# Patient Record
Sex: Male | Born: 1945 | Hispanic: Yes | Marital: Married | Smoking: Former smoker
Health system: Southern US, Community
[De-identification: ages and names within clinical notes are randomized; demographics above are authoritative.]

## PROBLEM LIST (undated history)

## (undated) DIAGNOSIS — Z87442 Personal history of urinary calculi: Secondary | ICD-10-CM

## (undated) DIAGNOSIS — C61 Malignant neoplasm of prostate: Secondary | ICD-10-CM

## (undated) DIAGNOSIS — I499 Cardiac arrhythmia, unspecified: Secondary | ICD-10-CM

## (undated) DIAGNOSIS — I10 Essential (primary) hypertension: Secondary | ICD-10-CM

## (undated) DIAGNOSIS — I213 ST elevation (STEMI) myocardial infarction of unspecified site: Secondary | ICD-10-CM

## (undated) DIAGNOSIS — I2510 Atherosclerotic heart disease of native coronary artery without angina pectoris: Secondary | ICD-10-CM

## (undated) DIAGNOSIS — M1990 Unspecified osteoarthritis, unspecified site: Secondary | ICD-10-CM

## (undated) DIAGNOSIS — R570 Cardiogenic shock: Secondary | ICD-10-CM

## (undated) DIAGNOSIS — I4891 Unspecified atrial fibrillation: Secondary | ICD-10-CM

## (undated) DIAGNOSIS — E119 Type 2 diabetes mellitus without complications: Secondary | ICD-10-CM

## (undated) DIAGNOSIS — Z95810 Presence of automatic (implantable) cardiac defibrillator: Secondary | ICD-10-CM

## (undated) DIAGNOSIS — K219 Gastro-esophageal reflux disease without esophagitis: Secondary | ICD-10-CM

## (undated) DIAGNOSIS — N209 Urinary calculus, unspecified: Secondary | ICD-10-CM

## (undated) DIAGNOSIS — E559 Vitamin D deficiency, unspecified: Secondary | ICD-10-CM

## (undated) DIAGNOSIS — I639 Cerebral infarction, unspecified: Secondary | ICD-10-CM

## (undated) HISTORY — PX: ICD IMPLANT: EP1208

## (undated) SURGERY — LEFT HEART CATH
Anesthesia: Moderate Sedation

---

## 2004-02-13 ENCOUNTER — Emergency Department: Payer: Self-pay | Admitting: Emergency Medicine

## 2004-03-20 ENCOUNTER — Ambulatory Visit: Payer: Self-pay | Admitting: Family Medicine

## 2004-06-26 ENCOUNTER — Ambulatory Visit: Payer: Self-pay | Admitting: Gastroenterology

## 2008-03-11 DIAGNOSIS — C61 Malignant neoplasm of prostate: Secondary | ICD-10-CM

## 2008-03-11 HISTORY — DX: Malignant neoplasm of prostate: C61

## 2008-03-11 HISTORY — PX: INSERTION PROSTATE RADIATION SEED: SUR718

## 2010-03-27 ENCOUNTER — Ambulatory Visit: Payer: Self-pay | Admitting: Rheumatology

## 2010-06-22 ENCOUNTER — Ambulatory Visit: Payer: Self-pay | Admitting: Gastroenterology

## 2010-06-27 LAB — PATHOLOGY REPORT

## 2011-06-14 ENCOUNTER — Ambulatory Visit: Payer: Self-pay | Admitting: Anesthesiology

## 2011-06-19 ENCOUNTER — Ambulatory Visit: Payer: Self-pay | Admitting: Podiatry

## 2011-11-14 ENCOUNTER — Emergency Department: Payer: Self-pay | Admitting: Emergency Medicine

## 2011-11-14 LAB — URINALYSIS, COMPLETE
Bilirubin,UR: NEGATIVE
Ketone: NEGATIVE
Nitrite: NEGATIVE
Ph: 5 (ref 4.5–8.0)
RBC,UR: 13 /HPF (ref 0–5)
Squamous Epithelial: NONE SEEN

## 2011-11-14 LAB — DIFFERENTIAL
Basophil %: 0.2 %
Eosinophil #: 0 10*3/uL (ref 0.0–0.7)
Eosinophil %: 0.4 %
Lymphocyte #: 1 10*3/uL (ref 1.0–3.6)
Neutrophil #: 8.5 10*3/uL — ABNORMAL HIGH (ref 1.4–6.5)
Neutrophil %: 83.6 %

## 2011-11-14 LAB — COMPREHENSIVE METABOLIC PANEL
Albumin: 4 g/dL (ref 3.4–5.0)
BUN: 21 mg/dL — ABNORMAL HIGH (ref 7–18)
Bilirubin,Total: 0.5 mg/dL (ref 0.2–1.0)
Chloride: 103 mmol/L (ref 98–107)
Glucose: 194 mg/dL — ABNORMAL HIGH (ref 65–99)
Osmolality: 282 (ref 275–301)
Potassium: 4.2 mmol/L (ref 3.5–5.1)
Sodium: 137 mmol/L (ref 136–145)

## 2011-11-14 LAB — CBC
MCV: 83 fL (ref 80–100)
Platelet: 221 10*3/uL (ref 150–440)
RBC: 4.85 10*6/uL (ref 4.40–5.90)
RDW: 14.2 % (ref 11.5–14.5)
WBC: 10.4 10*3/uL (ref 3.8–10.6)

## 2012-03-11 DIAGNOSIS — I639 Cerebral infarction, unspecified: Secondary | ICD-10-CM

## 2012-03-11 DIAGNOSIS — I213 ST elevation (STEMI) myocardial infarction of unspecified site: Secondary | ICD-10-CM

## 2012-03-11 HISTORY — PX: CORONARY ANGIOPLASTY WITH STENT PLACEMENT: SHX49

## 2012-03-11 HISTORY — DX: Cerebral infarction, unspecified: I63.9

## 2012-03-11 HISTORY — DX: ST elevation (STEMI) myocardial infarction of unspecified site: I21.3

## 2012-06-11 ENCOUNTER — Ambulatory Visit: Payer: Self-pay | Admitting: Rheumatology

## 2012-07-02 ENCOUNTER — Inpatient Hospital Stay: Payer: Self-pay | Admitting: Family Medicine

## 2012-07-02 LAB — TSH: Thyroid Stimulating Horm: 0.375 u[IU]/mL — ABNORMAL LOW

## 2012-07-02 LAB — URINALYSIS, COMPLETE
Bacteria: NONE SEEN
Glucose,UR: NEGATIVE mg/dL (ref 0–75)
Ketone: NEGATIVE
Nitrite: NEGATIVE
Ph: 6 (ref 4.5–8.0)
Protein: NEGATIVE
RBC,UR: 2 /HPF (ref 0–5)
Specific Gravity: 1.014 (ref 1.003–1.030)
Squamous Epithelial: <1
WBC UR: 20 /HPF (ref 0–5)

## 2012-07-02 LAB — CBC WITH DIFFERENTIAL/PLATELET
Basophil #: 0.1 10*3/uL (ref 0.0–0.1)
Eosinophil %: 0 %
HCT: 35 % — ABNORMAL LOW (ref 40.0–52.0)
HGB: 11.3 g/dL — ABNORMAL LOW (ref 13.0–18.0)
Lymphocyte #: 0.3 10*3/uL — ABNORMAL LOW (ref 1.0–3.6)
Lymphocyte %: 2.3 %
MCHC: 32.2 g/dL (ref 32.0–36.0)
MCV: 80 fL (ref 80–100)
Monocyte #: 0.3 x10 3/mm (ref 0.2–1.0)
Neutrophil #: 14 10*3/uL — ABNORMAL HIGH (ref 1.4–6.5)
RDW: 16.2 % — ABNORMAL HIGH (ref 11.5–14.5)

## 2012-07-02 LAB — COMPREHENSIVE METABOLIC PANEL
Albumin: 3 g/dL — ABNORMAL LOW (ref 3.4–5.0)
Anion Gap: 5 — ABNORMAL LOW (ref 7–16)
BUN: 19 mg/dL — ABNORMAL HIGH (ref 7–18)
Bilirubin,Total: 0.5 mg/dL (ref 0.2–1.0)
Chloride: 98 mmol/L (ref 98–107)
Co2: 34 mmol/L — ABNORMAL HIGH (ref 21–32)
Creatinine: 0.34 mg/dL — ABNORMAL LOW (ref 0.60–1.30)
EGFR (African American): >60
EGFR (Non-African Amer.): >60
Glucose: 180 mg/dL — ABNORMAL HIGH (ref 65–99)
SGOT(AST): 170 U/L — ABNORMAL HIGH (ref 15–37)
SGPT (ALT): 434 U/L — ABNORMAL HIGH (ref 12–78)
Total Protein: 6.6 g/dL (ref 6.4–8.2)

## 2012-07-04 LAB — CBC WITH DIFFERENTIAL/PLATELET
Basophil %: 0 %
Eosinophil %: 0.3 %
HCT: 34 % — ABNORMAL LOW (ref 40.0–52.0)
HGB: 10.9 g/dL — ABNORMAL LOW (ref 13.0–18.0)
Lymphocyte #: 1.2 10*3/uL (ref 1.0–3.6)
MCH: 25.4 pg — ABNORMAL LOW (ref 26.0–34.0)
MCHC: 32 g/dL (ref 32.0–36.0)
MCV: 80 fL (ref 80–100)
Neutrophil #: 7.9 10*3/uL — ABNORMAL HIGH (ref 1.4–6.5)
Platelet: 340 10*3/uL (ref 150–440)
RBC: 4.28 10*6/uL — ABNORMAL LOW (ref 4.40–5.90)
RDW: 16.1 % — ABNORMAL HIGH (ref 11.5–14.5)
WBC: 10 10*3/uL (ref 3.8–10.6)

## 2012-07-04 LAB — COMPREHENSIVE METABOLIC PANEL
Albumin: 2.6 g/dL — ABNORMAL LOW (ref 3.4–5.0)
Alkaline Phosphatase: 68 U/L (ref 50–136)
Anion Gap: 5 — ABNORMAL LOW (ref 7–16)
BUN: 16 mg/dL (ref 7–18)
Bilirubin,Total: 0.4 mg/dL (ref 0.2–1.0)
Calcium, Total: 8.6 mg/dL (ref 8.5–10.1)
Chloride: 101 mmol/L (ref 98–107)
Creatinine: 0.29 mg/dL — ABNORMAL LOW (ref 0.60–1.30)
EGFR (Non-African Amer.): >60
Osmolality: 280 (ref 275–301)
SGOT(AST): 116 U/L — ABNORMAL HIGH (ref 15–37)
SGPT (ALT): 349 U/L — ABNORMAL HIGH (ref 12–78)
Sodium: 138 mmol/L (ref 136–145)
Total Protein: 5.5 g/dL — ABNORMAL LOW (ref 6.4–8.2)

## 2012-07-05 LAB — COMPREHENSIVE METABOLIC PANEL
Alkaline Phosphatase: 69 U/L (ref 50–136)
Anion Gap: 5 — ABNORMAL LOW (ref 7–16)
Bilirubin,Total: 0.5 mg/dL (ref 0.2–1.0)
Chloride: 103 mmol/L (ref 98–107)
Co2: 32 mmol/L (ref 21–32)
Creatinine: 0.26 mg/dL — ABNORMAL LOW (ref 0.60–1.30)
EGFR (African American): >60
Glucose: 91 mg/dL (ref 65–99)
Potassium: 3.8 mmol/L (ref 3.5–5.1)
SGOT(AST): 111 U/L — ABNORMAL HIGH (ref 15–37)
Sodium: 140 mmol/L (ref 136–145)
Total Protein: 5.7 g/dL — ABNORMAL LOW (ref 6.4–8.2)

## 2012-07-05 LAB — CK: CK, Total: 2044 U/L — ABNORMAL HIGH (ref 35–232)

## 2012-07-06 LAB — CBC WITH DIFFERENTIAL/PLATELET
Basophil #: 0 10*3/uL (ref 0.0–0.1)
Basophil %: 0.1 %
Eosinophil #: 0.1 10*3/uL (ref 0.0–0.7)
HCT: 34.7 % — ABNORMAL LOW (ref 40.0–52.0)
Lymphocyte #: 1.2 10*3/uL (ref 1.0–3.6)
Lymphocyte %: 12.8 %
MCH: 25.8 pg — ABNORMAL LOW (ref 26.0–34.0)
MCV: 78 fL — ABNORMAL LOW (ref 80–100)
Monocyte %: 6.7 %
Neutrophil #: 7.6 10*3/uL — ABNORMAL HIGH (ref 1.4–6.5)
Neutrophil %: 79.7 %
Platelet: 313 10*3/uL (ref 150–440)
RBC: 4.44 10*6/uL (ref 4.40–5.90)
RDW: 16 % — ABNORMAL HIGH (ref 11.5–14.5)

## 2012-09-03 ENCOUNTER — Encounter: Payer: Self-pay | Admitting: Family Medicine

## 2012-09-08 ENCOUNTER — Encounter: Payer: Self-pay | Admitting: Family Medicine

## 2012-10-09 ENCOUNTER — Encounter: Payer: Self-pay | Admitting: Family Medicine

## 2012-11-09 ENCOUNTER — Encounter: Payer: Self-pay | Admitting: Family Medicine

## 2012-12-09 ENCOUNTER — Encounter: Payer: Self-pay | Admitting: Family Medicine

## 2013-01-09 ENCOUNTER — Encounter: Payer: Self-pay | Admitting: Family Medicine

## 2013-07-22 ENCOUNTER — Emergency Department: Payer: Self-pay | Admitting: Emergency Medicine

## 2013-11-13 ENCOUNTER — Emergency Department: Payer: Self-pay | Admitting: Emergency Medicine

## 2014-07-01 NOTE — Consult Note (Signed)
Brief Consult Note: Comments: subjectively better,swallow eval helpful, vss , but still 3/5 hip flexor weakness, CPK down,     rec Rehab placement till further  improvement in strength,PT,convert to oral steriods 60 mg qd with diabetes management.   thank you for your care of him.  Electronic Signatures: Leeanne Mannan., Eugene Gavia (MD)  (Signed 28-Apr-14 08:26)  Authored: Brief Consult Note   Last Updated: 28-Apr-14 08:26 by Leeanne Mannan., Eugene Gavia (MD)

## 2014-07-01 NOTE — H&P (Signed)
PATIENT NAME:  Jeff Boone, Jeff Boone MR#:  [id number] DATE OF BIRTH:  Aug 10, 1945  DATE OF ADMISSION:  07/02/2012  PRIMARY CARE PHYSICIAN: Youlanda Roys. Lovie Macadamia, MD  RHEUMATOLOGY: Emmaline Kluver., MD  CARDIOLOGY: At [facility].  HISTORY OF PRESENT ILLNESS: The patient is a very pleasant 69 year old Caucasian gentleman who is admitted from Dr. Lerry Paterson office after the patient was found to have significant proximal muscle weakness for the last several weeks to months along with significant weight loss. The patient also has noted increasing swallowing difficulty for the last few weeks. He is able to swallow thin liquids; however, when it comes to chunks of food, he has to take liquids to help him pass the food. He has had extensive workup recently for his "myopathy" and a muscle biopsy was done recently. He was found to have elevated CPK in the 7000s. He being off of his statins for the last 2 months. The patient is admitted to rule out malignancy or associated inflammatory myopathy. He is being admitted for further evaluation and management.   PAST MEDICAL HISTORY:  1.  Acute STEMI in January 2014 secondary to occluded LAD, status post bare metal stent placement with IABP for cardiogenic shock. This was done at [facility]. Cardiac MRI showed EF of 35% with evidence of ischemic cardiomyopathy. Echo showed EF of 40% with akinetic apex.  2.  Atrial fibrillation in the setting of STEMI. CHADS2 score of 5 with history of hypertension, CHF, diabetes and stroke; hence, he was started on apixaban/Eliquis.  3.  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 This was in January 2014. The patient had symptoms of left homonymous hemianopsia. CTA showed acute right PCA occlusion, status post emergent intra-arterial TPA and clot retrieval from left distal PCA by interventional neuroradiology team at [facility]  4.  Type 2 diabetes.  5.  Weight  loss with ongoing workup as outpatient.  6.  Proximal myopathy/myositis with generalized muscle weakness, elevation of CK and AST, ALT. The patient has followed up with Dr. Precious Reel as outpatient. He was started on 60 mg of prednisone as outpatient. Recently got his muscle biopsy done on 06/17/2012 and it showed evidence of myopathy/myositis. There is a possibility of immune-mediated inflammatory myopathy.  7.  The patient also underwent EMG and nerve conduction study with Dr. Manuella Ghazi of neurology at [facility], which showed impression of severe acute myopathy with proximal predominance, mild sensory polyneuropathy.  8.  History of psoriatic arthritis.  9.  Hypertension.  10.  Hyperlipidemia.  11.  Prostate cancer, status post recurrence in 2010, follows with Dr. Yves Dill now. The patient is cancer-free at this time.  12.  History of positive CMV. 13.  Vitamin D deficiency.  14.  History of GI bleed and ulcer.  15.  History of nephrolithiasis.   CURRENT MEDICATIONS:  1.  Uroxatral extended-release tablet 10 mg daily.  2.  Metformin 1000 mg daily.  3.  Omega-3, 1 capsule daily.  4.  Eliquis 2.5 mg daily.  5.  Aspirin 81 mg daily.  6.  Plavix 75 mg daily.  7.  Lasix 20 mg daily as needed.  8.  Lisinopril 2.5 mg daily.  9.  Prednisone 20 mg 1 tablet 3 times a day.   ALLERGIES: XYLOCAINE.   FAMILY HISTORY: Positive for CAD.   SOCIAL HISTORY: The patient is a nonsmoker. Currently retired. He lives at home with his family.   REVIEW OF SYSTEMS:  CONSTITUTIONAL: No fever. Positive for fatigue and weakness and weight loss. The patient states he has lost more than 15 pounds since January 2014.  EYES: No blurred or double vision or glaucoma.  EARS, NOSE, THROAT: No tinnitus, ear pain, hearing loss.  RESPIRATORY: No cough, wheeze, hemoptysis.  CARDIOVASCULAR: No chest pain, orthopnea, edema.  GASTROINTESTINAL: No nausea, vomiting, diarrhea or abdominal pain. Positive for difficulty  swallowing.  GENITOURINARY: No dysuria or hematuria.  ENDOCRINE: No polyuria, nocturia, thyroid problems.  HEMATOLOGIC: No anemia or easy bruising.  SKIN: No acne or rash.  MUSCULOSKELETAL: Positive for proximal myopathy.  NEUROLOGIC: No CVA or TIA.  PSYCHIATRIC: No anxiety or depression. All other systems reviewed and negative.   PHYSICAL EXAMINATION:  GENERAL: The patient is awake, alert, oriented x 3, not in acute distress.  VITAL SIGNS: He is afebrile. Pulse is 107. Blood pressure is 145/79. Sats are 98% on room air.  HEENT: Atraumatic, normocephalic. Pupils: PERRLA. EOM intact. Oral mucosa is moist.  NECK: Supple. No JVD. No carotid bruit.  LUNGS: Clear to auscultation bilaterally. No rales, rhonchi, respiratory distress or labored breathing.  HEART: Both the heart sounds are normal. Rate and rhythm regular. PMI not lateralized. Chest nontender.  EXTREMITIES: Good pedal pulses, good femoral pulses. No lower extremity edema. The patient has arthritic changes in both his upper extremities and lower extremities.  NEUROLOGIC: Grossly intact cranial nerves II through XII. The patient does have proximal motor weakness on his muscles in both upper and lower extremities. His reflexes are 1+ in both upper and lower extremities. Sensory exam is normal. There are no cranial nerve deficits noted grossly.  SKIN: Warm and dry.   LABORATORY AND RADIOLOGICAL DATA: CBC and comprehensive metabolic panel are pending. CT of the chest, abdomen and pelvis is pending at this time.   ASSESSMENT: A 69 year old patient with history of coronary artery disease, hypertension, hyperlipidemia, diabetes and more recent right posterior cerebral artery cerebrovascular accident, comes in with:  1.  Inflammatory myositis/polymyositis, severe with profound weakness and falls at home along with weight loss. The patient is admitted on the medical floor. Will start diet after he gets a CT scan of the abdomen, chest and pelvis.  Will continue intravenous fluids. Intravenous steroids will be started at 60 mg daily. Dr. Precious Reel will see the patient in consultation. Will obtain CT of the chest, abdomen and pelvis with intravenous and oral contrast to rule out malignancy. Will follow LFTs along with CPK and monitor kidney numbers. Will hold off on the patient's metformin given him getting intravenous contrast.  2.  Type 2 diabetes: Will continue sliding scale insulin and then resume metformin after 48 hours.  3.  Coronary artery disease, status post left anterior descending stent recently at [facility]. It is a bare metal stent. Will continue Plavix.  4.  History of right posterior cerebral artery circulation cerebrovascular accident with history of atrial fibrillation, status post myocardial infarction in January 2014. The patient is currently on Eliquis. Will continue 2.5 mg by mouth daily at this time.  5.  Hyperlipidemia: Hold off on statins at this time given workup for myositis/myopathy.  6.  Hypertension: Will continue lisinopril.  7.  Deep vein thrombosis prophylaxis: The patient already is on Eliquis and Plavix at this time.  8.  Dysphagia/swallowing difficulty: Will have speech therapy see patient.   Further workup per the patient's clinical course. Hospital admission plan was discussed with the patient and patient's son, who was present in  the room.   TIME SPENT: 55 minutes.   ____________________________ Hart Rochester Posey Pronto, MD sap:jm D: 07/02/2012 16:49:07 ET T: 07/02/2012 17:13:25 ET JOB#: [id number]  cc: Kayde Atkerson A. Posey Pronto, MD, <Dictator> Emmaline Kluver., MD Youlanda Roys. Lovie Macadamia, MD Ilda Basset MD ELECTRONICALLY SIGNED 07/10/2012 6:56

## 2014-07-01 NOTE — Consult Note (Signed)
Brief Consult Note: Comments: swallowing better,t notes noted, vss, but 3/5 ticeps, hip flexor . wife will have trouble taking care of him at home, but home health may help if unabe to go to rehab. fall risk.     rec prednisone 60 mg po qam, diabetes management, PPI, Ca and Vit D.  Electronic Signatures: Leeanne Mannan., Eugene Gavia (MD)  (Signed 29-Apr-14 08:22)  Authored: Brief Consult Note   Last Updated: 29-Apr-14 08:22 by Leeanne Mannan., Eugene Gavia (MD)

## 2014-07-01 NOTE — Consult Note (Signed)
PATIENT NAME:  Jeff Boone, Jeff Boone MR#:  [id number] DATE OF BIRTH:  1945-11-22  DATE OF CONSULTATION:  07/02/2012  CONSULTING PHYSICIAN:  Emmaline Kluver., MD  HISTORY OF PRESENT ILLNESS: The patient is a 69 year old male whose major complaint is muscle weakness.  Complicated story, he had hospitalization at [facility] after Christmas with heart attack and then cerebrovascular accident. He had to be on Lipitor during the hospital stay and may be for 2 weeks after that, started becoming profoundly weak, hard to get up and down from a chair. CPK went to 7000. He had transient hospitalization at [facility], worried about a statin myopathy. He was discharged and seen by me where CPK was still elevated.  He had EMG and it looked like an inflammatory myopathy and myositis. He had MRI that look like inflammatory myopathy with edema. He had biopsy showing inflammatory cells, no vasculitis.  He was placed on steroids. He is diabetic. He has been getting profoundly weaker. He has had some falls. Family cannot take care of him at home, cannot get up and out from a chair or walk unassisted. He has lost weight. He has had nocturia with urinary frequency. Sugars have gone up to about 300. Last CPK was down to 7000 to 3400, but he was profoundly weak. Liver functions were elevated. With his weight loss, there was a concern about malignancy. He has not had CT, but had prior prostate cancer.  PSA recently normal. He had a recurrence in 2010 with seeds. Prior work-up for abnormal liver functions with liver biopsy was negative. Because of his profound weakness, inability to maintain himself at home, he is admitted.   PAST MEDICAL HISTORY: 1.  Diabetes.  2.  GI bleed.  3.  Nephrolithiasis.  4.  Hypertension.  5.  Hyperlipidemia.  6.  Psoriatic arthritis, previously on methotrexate and Plaquenil.  7.  Prostate cancer.  8. Abnormal liver function, status post liver biopsy.  9.  Polymyositis or inflammatory myopathy.  10.   Surgical hammertoe repair.  11.  Muscle biopsy.  12.  Liver biopsy.   SOCIAL HISTORY: No cigarettes or alcohol.   FAMILY HISTORY: Negative for connective tissue disease.   REVIEW OF SYSTEMS: Some difficulty swallowing. No shortness of breath. No Raynaud's. No skin rash.   MEDICATIONS: Metformin 1000 mg in the  morning, 500 mg in the evening, enteric coated aspirin one a day, Plavix 75 mg 1 p.o. daily, Lasix p.r.n., lisinopril 75 mg daily, prednisone 60 mg p.o. daily.   ALLERGIES: XYLOCAINE.   PHYSICAL EXAMINATION: GENERAL: Pleasant male, wheelchair bound, weak, cachectic-appearing. VITAL SIGNS:  Cannot weigh, blood pressure 120/64, pulse regular at 90, temperature 98.  SKIN: With some psoriatic patches on his elbows and nails. No telangiectasias. No gottrens patch, no heliotrope, no erythema of the shoulders.  HEENT: Sclerae clear.  NECK: Good carotid upstroke.  HEART:  Regular rhythm.  No significant murmur.  CHEST: Clear.  ABDOMEN: No visceromegaly.  EXTREMITIES: No significant edema.  MUSCULOSKELETAL:  Good range of motion of neck and shoulders. He has synovitis across the DIPs. Hips move well. Knees without effusions.  NEUROLOGIC: He has symmetric lower extremity reflexes but has 4-/5 biceps and triceps 3/5 hip flexors, 3/5 neck flexors, cannot get up from a sitting position or maintain a standing position.   IMPRESSION: 1.  Profound inflammatory myopathy. Rule out malignancy related. Rule out statin-related or idiopathic.  2.  Diabetes.  3.  Recent MI/CVA .  4.  Weight loss. Rule out malignancy  with prior history of prostate cancer.  5.  Elevated liver functions.   PLAN: 1.  Steroids continuation.  I would like to get his steroids longer before adding  any immunosuppressive drugs. CPK has come down. Muscle strength has not improved.  2.  Physical therapy and rehab.  3.  Work-up malignancy with abdominal, pelvic and chest CT.  4.  May need evaluation for dysphagia.  5.   Diabetic control. Probably needs insulin, given his steroids. Probably needs proton pump inhibitor protection for his steroids,  given his steroids with prior history of peptic ulcer disease.  6.  Sent muscle antibody panel from the office.   Thank you very much for taking care of him.   ____________________________ Emmaline Kluver., MD gwk:cc D: 07/02/2012 16:45:32 ET T: 07/02/2012 17:34:43 ET JOB#: [id number]  cc: Emmaline Kluver., MD, <Dictator> Youlanda Roys. Lovie Macadamia, MD Ovidio Hanger MD ELECTRONICALLY SIGNED 07/06/2012 8:24

## 2014-07-01 NOTE — Consult Note (Signed)
Brief Consult Note: Comments: no change in muscle weakness,some difficulty swallowing, vss ,3/5 prox muscle weakness, CT noted.      rec PT,speech eval, followup TSH,(T3 T4 ordered,R/o hyperthyroid), Rehab, continue steriods, glucose management,consider adding PPI given his ulcer history.       thanks for your care of him.  Electronic Signatures: Leeanne Mannan., Eugene Gavia (MD)  (Signed 25-Apr-14 10:16)  Authored: Brief Consult Note   Last Updated: 25-Apr-14 10:16 by Leeanne Mannan., Eugene Gavia (MD)

## 2014-07-01 NOTE — Discharge Summary (Signed)
PATIENT NAME:  Jeff Boone, Jeff Boone MR#:  [id number] DATE OF BIRTH:  09/30/1945  DATE OF ADMISSION:  07/02/2012 DATE OF DISCHARGE:  07/07/2012  DISPOSITION: Home.   ADMISSION DIAGNOSIS:  Severe weakness.   FINAL DIAGNOSES: 1.  Statin-induced profound myositis.  2.  Elevated LFTs.  3.  Abnormal TSH related to stress.  4.  Resent diagnosis of coronary artery disease  and cerebrovascular accident on previous admission at [facility]. 5.  Type 2 non-insulin-dependent diabetes.  6.  Urinary tract infection.    MEDICATIONS AT DISCHARGE:  1.  Omeprazole 20 mg once daily.  2.  Alfuzosin 10 mg once a day.  3.  Metformin 1000 mg in the morning and 500 mg at bedtime.  4.  Prednisone 20 mg once a day.  5.  Lisinopril 2.5 mg once a day.  6.  Plavix 75 mg once a day.  7.  Omega-3 fatty acids 1 gram once a day. 8. Glucerna shakes that 2 times daily. 8. Apixaban  2.5 mg once a day.  9.  Ciprofloxacin 500 mg every 12 hours for two more days.    HOME HEALTH:  Therapy with speech therapy and physical therapy.   DIET:  Low fat, low cholesterol as Glucerna.   FOLLOW-UP: Dr. Percell Boston in 5-7 days for taper of steroid treatment and recheck of labs.   HOSPITAL COURSE: The patient is a very nice 69 year old gentleman who has history of coronary artery disease. He has been recently diagnosed with myocardial infarction  for what he was admitted at [facility]. The patient presented to [facility] on 07/02/2012.  Please see H and P dictated by Dr. Fritzi Mandes at admission.  He also follows with Dr. Precious Reel for his rheumatoid arthritis. During one of these office visits, he was found to have significant proximal muscle weakness that has been getting worse for past several weeks. The patient has significant weight loss within the past weeks, likely due to this breakdown of the muscle and he has increased difficulty with swallowing. He was diagnosed with myopathy likely due to recently started  statins.  His  CPK was 7000 at admission. Apparently the patient has been off, statins for 2 months He was at [facility] January 2014, secondary to occluded LAD and he underwent a bare metal stent placement and he was on IAPB for cardiogenic shock. His ejection fraction was 35% at that moment and an echocardiogram was done showing a 40% ejection fraction withwall abnoramlity motion. At the same time, the patient had atrial fibrillation, [place] score was around 5 and he was started on Eliquis. During this hospitalization, the patient also had arterial ischemic right PCA stroke right after his STEMI  in January 2014 that left him with left homonymous  hemianopsia. CTI of the brain was done at that moment showing acute right PCA occlusion and the patient had an emergent intra-arterial TPA. The patient was admitted for treatment of his myositis, which was likely due to statins. The patient was put on 60 mg daily of prednisone and had some moderate improvement of the symptoms. The patient was on intravenous steroids at the beginning and now he is going to go home on 60 mg once a day. As far as his other medical problems:  1.  Inflammatory myositis. The patient was recommended at some point by Dr. Jefm Bryant to go to rehab. Unfortunately the patient does not meet criteria as he ambulates more than 200 feet. The patient is going to go home  with home health therapy.  2.  Type 2 diabetes. Continue metformin.  3.  Coronary artery disease. Since the patient is not able to take statins, we recommended drastic changes in diet and use of fatty acids like fish oil if patient is able to tolerate it. That he needs to continue the use of Plavix since he has a bare metal stent.  4. History of atrial fibrillation. The patient also has a history of cerebrovascular accident, for which he is taking Eliquis. Continue Eliquis at home.   5. Hyperlipidemia. The statin medication has been removed.   6.  Hypertension. Well controlled lisinopril.   7.  Dysphagia and swallowing difficulty. This is due to general dilatation of the muscles. Speech therapy has been consulted for outpatient and the patient is going to be taking his steroids.   TIME SPENT: I spent about 45 minutes with this discharge today.   ____________________________ Westley Sink, MD rsg:cc D: 07/07/2012 15:20:30 ET T: 07/07/2012 18:20:08 ET JOB#: [id number]  cc: [place] Sink, MD, <Dictator> Emmaline Kluver., MD Youlanda Roys. Lovie Macadamia, MD Roselie Awkward America Brown MD ELECTRONICALLY SIGNED 07/11/2012 11:08

## 2014-07-01 NOTE — Consult Note (Signed)
Brief Consult Note: Consult note dictated.   Discussed with Attending MD.   Comments: 1 inflammatory myositis/polymyositis ,severe , with profound weakness, falls  2 weight loss, r/o malignancy 3 diabetes, worse on steriods 4 recent MI,CVA  5 abnl LFTS , may be muscle elevations of transaminases    rec 1 steriods 2 diabetic control 3 w/up malignancy with CT chest/abd/pelvis 4 may swallowing eval 5 PT/OT 6 rehab        would like to give steriods longer before adding an immunosuppressive         i sent muscle antibodies /anti HMGCR from office            thank you  for  your help with this patient.  Electronic Signatures: Leeanne Mannan., Eugene Gavia (MD)  (Signed 24-Apr-14 16:35)  Authored: Brief Consult Note   Last Updated: 24-Apr-14 16:35 by Leeanne Mannan., Eugene Gavia (MD)

## 2014-12-09 ENCOUNTER — Encounter: Payer: Self-pay | Admitting: *Deleted

## 2014-12-12 ENCOUNTER — Ambulatory Visit
Admission: RE | Admit: 2014-12-12 | Discharge: 2014-12-12 | Disposition: A | Discharge status: Home / Self Care | Payer: Medicare Other | Source: Ambulatory Visit | Attending: Gastroenterology | Admitting: Gastroenterology

## 2014-12-12 ENCOUNTER — Encounter: Payer: Self-pay | Admitting: Gastroenterology

## 2014-12-12 ENCOUNTER — Ambulatory Visit: Payer: Medicare Other | Admitting: Anesthesiology

## 2014-12-12 ENCOUNTER — Encounter
Admission: RE | Disposition: A | Discharge status: Home / Self Care | Payer: Self-pay | Source: Ambulatory Visit | Attending: Gastroenterology

## 2014-12-12 DIAGNOSIS — I252 Old myocardial infarction: Secondary | ICD-10-CM | POA: Insufficient documentation

## 2014-12-12 DIAGNOSIS — E119 Type 2 diabetes mellitus without complications: Secondary | ICD-10-CM | POA: Insufficient documentation

## 2014-12-12 DIAGNOSIS — Z7901 Long term (current) use of anticoagulants: Secondary | ICD-10-CM | POA: Insufficient documentation

## 2014-12-12 DIAGNOSIS — K635 Polyp of colon: Secondary | ICD-10-CM | POA: Diagnosis not present

## 2014-12-12 DIAGNOSIS — Z8673 Personal history of transient ischemic attack (TIA), and cerebral infarction without residual deficits: Secondary | ICD-10-CM | POA: Diagnosis not present

## 2014-12-12 DIAGNOSIS — Z1211 Encounter for screening for malignant neoplasm of colon: Secondary | ICD-10-CM | POA: Diagnosis present

## 2014-12-12 DIAGNOSIS — I10 Essential (primary) hypertension: Secondary | ICD-10-CM | POA: Diagnosis not present

## 2014-12-12 DIAGNOSIS — Z8546 Personal history of malignant neoplasm of prostate: Secondary | ICD-10-CM | POA: Diagnosis not present

## 2014-12-12 DIAGNOSIS — I4891 Unspecified atrial fibrillation: Secondary | ICD-10-CM | POA: Diagnosis not present

## 2014-12-12 DIAGNOSIS — Z7982 Long term (current) use of aspirin: Secondary | ICD-10-CM | POA: Insufficient documentation

## 2014-12-12 DIAGNOSIS — I2510 Atherosclerotic heart disease of native coronary artery without angina pectoris: Secondary | ICD-10-CM | POA: Diagnosis not present

## 2014-12-12 HISTORY — DX: Type 2 diabetes mellitus without complications: E11.9

## 2014-12-12 HISTORY — DX: Malignant neoplasm of prostate: C61

## 2014-12-12 HISTORY — DX: Cardiogenic shock: R57.0

## 2014-12-12 HISTORY — DX: Unspecified atrial fibrillation: I48.91

## 2014-12-12 HISTORY — DX: Vitamin D deficiency, unspecified: E55.9

## 2014-12-12 HISTORY — DX: Urinary calculus, unspecified: N20.9

## 2014-12-12 HISTORY — PX: COLONOSCOPY WITH PROPOFOL: SHX5780

## 2014-12-12 HISTORY — DX: Atherosclerotic heart disease of native coronary artery without angina pectoris: I25.10

## 2014-12-12 HISTORY — DX: Cerebral infarction, unspecified: I63.9

## 2014-12-12 HISTORY — DX: ST elevation (STEMI) myocardial infarction of unspecified site: I21.3

## 2014-12-12 HISTORY — DX: Essential (primary) hypertension: I10

## 2014-12-12 LAB — GLUCOSE, CAPILLARY: Glucose-Capillary: 99 mg/dL (ref 65–99)

## 2014-12-12 SURGERY — COLONOSCOPY WITH PROPOFOL
Anesthesia: General

## 2014-12-12 MED ORDER — PHENYLEPHRINE HCL 10 MG/ML IJ SOLN
INTRAMUSCULAR | Status: DC | PRN
  Administered 2014-12-12: 100 ug via INTRAVENOUS

## 2014-12-12 MED ORDER — METOPROLOL SUCCINATE ER 25 MG PO TB24
25.0000 mg | ORAL_TABLET | Freq: Once | ORAL | Status: AC
  Administered 2014-12-12: 25 mg via ORAL
  Filled 2014-12-12: qty 1

## 2014-12-12 MED ORDER — SODIUM CHLORIDE 0.9 % IV SOLN
INTRAVENOUS | Status: DC
Start: 2014-12-12 — End: 2014-12-12
  Administered 2014-12-12: 1000 mL via INTRAVENOUS

## 2014-12-12 MED ORDER — PROPOFOL 500 MG/50ML IV EMUL
INTRAVENOUS | Status: DC | PRN
  Administered 2014-12-12: 120 ug/kg/min via INTRAVENOUS

## 2014-12-12 MED ORDER — PROPOFOL 10 MG/ML IV BOLUS
INTRAVENOUS | Status: DC | PRN
  Administered 2014-12-12: 50 mg via INTRAVENOUS

## 2014-12-12 MED ORDER — SODIUM CHLORIDE 0.9 % IV SOLN: INTRAVENOUS | Status: DC

## 2014-12-12 MED ORDER — MIDAZOLAM HCL 2 MG/2ML IJ SOLN
INTRAMUSCULAR | Status: DC | PRN
  Administered 2014-12-12: 1 mg via INTRAVENOUS

## 2014-12-12 NOTE — H&P (Signed)
Primary Care Physician:  No primary care provider on file. Primary Gastroenterologist:  Dr. Candace Cruise  Pre-Procedure History & Physical: HPI:  Jeff Boone is a 69 y.o. male is here for an colonoscopy.   Past Medical History  Diagnosis Date  . Hypertension   . Diabetes mellitus without complication ([place])   . Coronary artery disease   . Urolithiasis   . STEMI (ST elevation myocardial infarction) ([place])   . Cardiogenic shock ([place])   . Atrial fibrillation ([place])   . Arterial ischemic stroke ([place])   . Prostate cancer ([place])   . Vitamin D deficiency     History reviewed. No pertinent past surgical history.  Prior to Admission medications   Medication Sig Start Date End Date Taking? Authorizing Provider  alendronate (FOSAMAX) 70 MG tablet Take 70 mg by mouth once a week. Take with a full glass of water on an empty stomach.   Yes Historical Provider, MD  alfuzosin (UROXATRAL) 10 MG 24 hr tablet Take 10 mg by mouth daily with breakfast.   Yes Historical Provider, MD  apixaban (ELIQUIS) 5 MG TABS tablet Take 5 mg by mouth 2 (two) times daily.   Yes Historical Provider, MD  aspirin 81 MG tablet Take 81 mg by mouth daily.   Yes Historical Provider, MD  lisinopril (PRINIVIL,ZESTRIL) 2.5 MG tablet Take 2.5 mg by mouth daily.   Yes Historical Provider, MD  metFORMIN (GLUMETZA) 1000 MG (MOD) 24 hr tablet Take 1,000 mg by mouth 2 (two) times daily with a meal.   Yes Historical Provider, MD  metoprolol succinate (TOPROL-XL) 25 MG 24 hr tablet Take 25 mg by mouth daily.   Yes Historical Provider, MD  omeprazole (PRILOSEC) 20 MG capsule Take 20 mg by mouth daily.   Yes Historical Provider, MD  predniSONE (DELTASONE) 5 MG tablet Take 5 mg by mouth every other day.   Yes Historical Provider, MD  tamsulosin (FLOMAX) 0.4 MG CAPS capsule Take 0.4 mg by mouth daily.   Yes Historical Provider, MD  Triamcinolone Acetonide (TRIAMCINOLONE 0.1 % CREAM : EUCERIN) CREA Apply 1 application topically 2 (two)  times daily.   Yes Historical Provider, MD    Allergies as of 10/31/2014  . (Not on File)    History reviewed. No pertinent family history.  Social History   Social History  . Marital Status: Married    Spouse Name: N/A  . Number of Children: N/A  . Years of Education: N/A   Occupational History  . Not on file.   Social History Main Topics  . Smoking status: Not on file  . Smokeless tobacco: Not on file  . Alcohol Use: Not on file  . Drug Use: Not on file  . Sexual Activity: Not on file   Other Topics Concern  . Not on file   Social History Narrative    Review of Systems: See HPI, otherwise negative ROS  Physical Exam: There were no vitals taken for this visit. General:   Alert,  pleasant and cooperative in NAD Head:  Normocephalic and atraumatic. Neck:  Supple; no masses or thyromegaly. Lungs:  Clear throughout to auscultation.    Heart:  Regular rate and rhythm. Abdomen:  Soft, nontender and nondistended. Normal bowel sounds, without guarding, and without rebound.   Neurologic:  Alert and  oriented x4;  grossly normal neurologically.  Impression/Plan: Jeff Boone is here for an colonoscopy to be performed for screening.  Risks, benefits, limitations, and alternatives regarding  colonoscopy have  been reviewed with the patient.  Questions have been answered.  All parties agreeable.   [name], [name], MD  12/12/2014, 8:27 AM

## 2014-12-12 NOTE — Anesthesia Postprocedure Evaluation (Signed)
  Anesthesia Post-op Note  Patient: Jeff Boone  Procedure(s) Performed: Procedure(s): COLONOSCOPY WITH PROPOFOL (N/A)  Anesthesia type:General  Patient location: PACU  Post pain: Pain level controlled  Post assessment: Post-op Vital signs reviewed, Patient's Cardiovascular Status Stable, Respiratory Function Stable, Patent Airway and No signs of Nausea or vomiting  Post vital signs: Reviewed and stable  Last Vitals:  Filed Vitals:   12/12/14 0947  BP: 115/59  Pulse: 66  Temp: 35.9 C  Resp: 13    Level of consciousness: awake, alert  and patient cooperative  Complications: No apparent anesthesia complications

## 2014-12-12 NOTE — Op Note (Signed)
[facility] Patient Name: [facility] Procedure Date: 12/12/2014 9:20 AM MRN: [id number] Account #: [id number] Date of Birth: [birth year] Admit Type: Outpatient Age: 69 Room: [facility] ENDO ROOM 4 Gender: Male Note Status: Finalized Procedure:         Colonoscopy Indications:       Screening for colorectal malignant neoplasm Providers:         [name]. Candace Cruise, MD Referring MD:      Youlanda Roys. Lovie Macadamia, MD (Referring MD) Medicines:         Monitored Anesthesia Care Complications:     No immediate complications. Procedure:         Pre-Anesthesia Assessment:                    - Prior to the procedure, a History and Physical was                     performed, and patient medications, allergies and                     sensitivities were reviewed. The patient's tolerance of                     previous anesthesia was reviewed.                    - The risks and benefits of the procedure and the sedation                     options and risks were discussed with the patient. All                     questions were answered and informed consent was obtained.                    - After reviewing the risks and benefits, the patient was                     deemed in satisfactory condition to undergo the procedure.                    After obtaining informed consent, the colonoscope was                     passed under direct vision. Throughout the procedure, the                     patient's blood pressure, pulse, and oxygen saturations                     were monitored continuously. The Colonoscope was                     introduced through the anus and advanced to the the cecum,                     identified by appendiceal orifice and ileocecal valve. The                     colonoscopy was performed without difficulty. The patient                     tolerated the procedure well. The quality of the bowel  preparation was good. Findings:     A diminutive polyp was found in the recto-sigmoid colon. The polyp was       sessile. The polyp was removed with a jumbo cold forceps. Resection and       retrieval were complete.      The exam was otherwise without abnormality. Impression:        - One diminutive polyp at the recto-sigmoid colon.                     Resected and retrieved.                    - The examination was otherwise normal. Recommendation:    - Discharge patient to home.                    - Await pathology results.                    - Repeat colonoscopy in 5-10 years for surveillance based                     on pathology results.                    - The findings and recommendations were discussed with the                     patient.                    - Resume eliquis tomorrow. Procedure Code(s): --- Professional ---                    [phone], Colonoscopy, flexible; with biopsy, single or                     multiple Diagnosis Code(s): --- Professional ---                    Z12.11, Encounter for screening for malignant neoplasm of                     colon                    D12.7, Benign neoplasm of rectosigmoid junction CPT copyright 2014 American Medical Association. All rights reserved. The codes documented in this report are preliminary and upon coder review may  be revised to meet current compliance requirements. Hulen Luster, MD 12/12/2014 9:42:46 AM This report has been signed electronically. Number of Addenda: 0 Note Initiated On: 12/12/2014 9:20 AM Scope Withdrawal Time: 0 hours 8 minutes 30 seconds  Total Procedure Duration: 0 hours 13 minutes 40 seconds       [facility]

## 2014-12-12 NOTE — Anesthesia Preprocedure Evaluation (Signed)
Anesthesia Evaluation  Patient identified by MRN, date of birth, ID band Patient awake    Reviewed: Allergy & Precautions, NPO status , Patient's Chart, lab work & pertinent test results  Airway Mallampati: II       Dental no notable dental hx.    Pulmonary    Pulmonary exam normal        Cardiovascular hypertension, + CAD and + Past MI  Normal cardiovascular exam+ Cardiac Defibrillator      Neuro/Psych CVA, No Residual Symptoms    GI/Hepatic negative GI ROS, Neg liver ROS,   Endo/Other  diabetes, Type 2, Oral Hypoglycemic Agents  Renal/GU negative Renal ROS     Musculoskeletal negative musculoskeletal ROS (+)   Abdominal Normal abdominal exam  (+)   Peds  Hematology negative hematology ROS (+)   Anesthesia Other Findings   Reproductive/Obstetrics                             Anesthesia Physical Anesthesia Plan  ASA: III  Anesthesia Plan: General   Post-op Pain Management:    Induction: Intravenous  Airway Management Planned: Nasal Cannula  Additional Equipment:   Intra-op Plan:   Post-operative Plan:   Informed Consent: I have reviewed the patients History and Physical, chart, labs and discussed the procedure including the risks, benefits and alternatives for the proposed anesthesia with the patient or authorized representative who has indicated his/her understanding and acceptance.     Plan Discussed with: CRNA  Anesthesia Plan Comments:         Anesthesia Quick Evaluation

## 2014-12-12 NOTE — Transfer of Care (Signed)
Immediate Anesthesia Transfer of Care Note  Patient: Jeff Boone  Procedure(s) Performed: Procedure(s): COLONOSCOPY WITH PROPOFOL (N/A)  Patient Location: Endoscopy Unit  Anesthesia Type:General  Level of Consciousness: awake, alert , oriented and patient cooperative  Airway & Oxygen Therapy: Patient Spontanous Breathing and Patient connected to nasal cannula oxygen  Post-op Assessment: Report given to RN, Post -op Vital signs reviewed and stable and Patient moving all extremities X 4  Post vital signs: Reviewed and stable  Last Vitals:  Filed Vitals:   12/12/14 0947  BP: 115/59  Pulse: 66  Temp: 35.9 C  Resp: 13    Complications: No apparent anesthesia complications

## 2014-12-13 ENCOUNTER — Encounter: Payer: Self-pay | Admitting: Gastroenterology

## 2014-12-13 LAB — SURGICAL PATHOLOGY

## 2016-06-06 ENCOUNTER — Other Ambulatory Visit: Payer: Self-pay | Admitting: Rheumatology

## 2016-06-06 DIAGNOSIS — R634 Abnormal weight loss: Secondary | ICD-10-CM

## 2016-06-13 ENCOUNTER — Ambulatory Visit
Admission: RE | Admit: 2016-06-13 | Discharge: 2016-06-13 | Disposition: A | Discharge status: Home / Self Care | Payer: Medicare Other | Source: Ambulatory Visit | Attending: Rheumatology | Admitting: Rheumatology

## 2016-06-13 DIAGNOSIS — M47895 Other spondylosis, thoracolumbar region: Secondary | ICD-10-CM | POA: Diagnosis not present

## 2016-06-13 DIAGNOSIS — N281 Cyst of kidney, acquired: Secondary | ICD-10-CM | POA: Diagnosis not present

## 2016-06-13 DIAGNOSIS — K5730 Diverticulosis of large intestine without perforation or abscess without bleeding: Secondary | ICD-10-CM | POA: Diagnosis not present

## 2016-06-13 DIAGNOSIS — K760 Fatty (change of) liver, not elsewhere classified: Secondary | ICD-10-CM | POA: Diagnosis not present

## 2016-06-13 DIAGNOSIS — N432 Other hydrocele: Secondary | ICD-10-CM | POA: Insufficient documentation

## 2016-06-13 DIAGNOSIS — R634 Abnormal weight loss: Secondary | ICD-10-CM | POA: Insufficient documentation

## 2016-06-13 LAB — POCT I-STAT CREATININE: Creatinine, Ser: 0.7 mg/dL (ref 0.61–1.24)

## 2016-06-13 MED ORDER — IOPAMIDOL (ISOVUE-300) INJECTION 61%
100.0000 mL | Freq: Once | INTRAVENOUS | Status: AC | PRN
  Administered 2016-06-13: 100 mL via INTRAVENOUS

## 2016-10-10 ENCOUNTER — Encounter
Admission: RE | Admit: 2016-10-10 | Discharge: 2016-10-10 | Disposition: A | Discharge status: Home / Self Care | Payer: Medicare Other | Source: Ambulatory Visit | Attending: Urology | Admitting: Urology

## 2016-10-10 DIAGNOSIS — I252 Old myocardial infarction: Secondary | ICD-10-CM | POA: Insufficient documentation

## 2016-10-10 DIAGNOSIS — I4891 Unspecified atrial fibrillation: Secondary | ICD-10-CM | POA: Insufficient documentation

## 2016-10-10 DIAGNOSIS — I10 Essential (primary) hypertension: Secondary | ICD-10-CM | POA: Insufficient documentation

## 2016-10-10 DIAGNOSIS — Z95810 Presence of automatic (implantable) cardiac defibrillator: Secondary | ICD-10-CM | POA: Insufficient documentation

## 2016-10-10 DIAGNOSIS — R9431 Abnormal electrocardiogram [ECG] [EKG]: Secondary | ICD-10-CM | POA: Insufficient documentation

## 2016-10-10 DIAGNOSIS — Z01818 Encounter for other preprocedural examination: Secondary | ICD-10-CM | POA: Insufficient documentation

## 2016-10-10 HISTORY — DX: Gastro-esophageal reflux disease without esophagitis: K21.9

## 2016-10-10 HISTORY — DX: Unspecified osteoarthritis, unspecified site: M19.90

## 2016-10-10 HISTORY — DX: Cardiac arrhythmia, unspecified: I49.9

## 2016-10-10 HISTORY — DX: Presence of automatic (implantable) cardiac defibrillator: Z95.810

## 2016-10-10 HISTORY — DX: Personal history of urinary calculi: Z87.442

## 2016-10-10 NOTE — Patient Instructions (Addendum)
  Your procedure is scheduled on: 10-15-16 TUESDAY Report to Same Day Surgery 2nd floor medical mall [facility] Entrance-take elevator on left to 2nd floor.  Check in with surgery information desk.) To find out your arrival time please call [phone] between 1PM - 3PM on 10-14-16 MONDAY  Remember: Instructions that are not followed completely may result in serious medical risk, up to and including death, or upon the discretion of your surgeon and anesthesiologist your surgery may need to be rescheduled.    _x___ 1. Do not eat food or drink liquids after midnight. No gum chewing or hard candies.     __x__ 2. No Alcohol for 24 hours before or after surgery.   __x__3. No Smoking for 24 prior to surgery.   ____  4. Bring all medications with you on the day of surgery if instructed.    __x__ 5. Notify your doctor if there is any change in your medical condition     (cold, fever, infections).     Do not wear jewelry, make-up, hairpins, clips or nail polish.  Do not wear lotions, powders, or perfumes. You may wear deodorant.  Do not shave 48 hours prior to surgery. Men may shave face and neck.  Do not bring valuables to the hospital.    [facility] is not responsible for any belongings or valuables.               Contacts, dentures or bridgework may not be worn into surgery.  Leave your suitcase in the car. After surgery it may be brought to your room.  For patients admitted to the hospital, discharge time is determined by your treatment team.   Patients discharged the day of surgery will not be allowed to drive home.  You will need someone to drive you home and stay with you the night of your procedure.    Please read over the following fact sheets that you were given:     _x___ [place] WITH A SMALL SIP OF WATER. These include:  1. METOPROLOL  2. PREDNISONE  3. PRILOSEC (OMEPRAZOLE)  4.TAKE AN OMEPRAZOLE Monday NIGHT BEFORE BED  (10-14-16)  5.BRING YOUR LISINOPRIL BOTTLE WITH YOU TO [place]  6.  ____Fleets enema or Magnesium Citrate as directed.   ____ Use CHG Soap or sage wipes as directed on instruction sheet   ____ Use inhalers on the day of surgery and bring to hospital day of surgery  _X___ Stop Metformin and Janumet 2 days prior to surgery-LAST DOSE OF METFORMIN ON Saturday, AUGUST 4TH  ____ Take 1/2 of usual insulin dose the night before surgery and none on the morning surgery.   _x___ Follow recommendations from Cardiologist, Pulmonologist or PCP regarding stopping Aspirin, Coumadin, Pllavix ,Eliquis, Effient, or Pradaxa, and Pletal-PT STOPPED ELIQUIS ON 10-09-16.  INSTRUCTED PT TO CALL SURGEON/CARDIOLOGIST ABOUT STOPPING ASPIRIN  X____Stop Anti-inflammatories such as Advil, Aleve, Ibuprofen, Motrin, Naproxen, Naprosyn, Goodies powders or aspirin products NOW- OK to take Tylenol    ____ Stop supplements until after surgery.     ____ Bring C-Pap to the hospital.

## 2016-10-11 ENCOUNTER — Encounter
Admission: RE | Admit: 2016-10-11 | Discharge: 2016-10-11 | Disposition: A | Discharge status: Home / Self Care | Payer: Medicare Other | Source: Ambulatory Visit | Attending: Urology | Admitting: Urology

## 2016-10-11 DIAGNOSIS — Z95810 Presence of automatic (implantable) cardiac defibrillator: Secondary | ICD-10-CM | POA: Diagnosis not present

## 2016-10-11 DIAGNOSIS — Z01818 Encounter for other preprocedural examination: Secondary | ICD-10-CM | POA: Diagnosis present

## 2016-10-11 DIAGNOSIS — I10 Essential (primary) hypertension: Secondary | ICD-10-CM | POA: Diagnosis not present

## 2016-10-11 DIAGNOSIS — R9431 Abnormal electrocardiogram [ECG] [EKG]: Secondary | ICD-10-CM | POA: Diagnosis not present

## 2016-10-11 DIAGNOSIS — I252 Old myocardial infarction: Secondary | ICD-10-CM | POA: Diagnosis not present

## 2016-10-11 DIAGNOSIS — I4891 Unspecified atrial fibrillation: Secondary | ICD-10-CM | POA: Diagnosis not present

## 2016-10-11 LAB — CBC
HEMATOCRIT: 37.5 % — AB (ref 40.0–52.0)
HEMOGLOBIN: 12.5 g/dL — AB (ref 13.0–18.0)
MCH: 28 pg (ref 26.0–34.0)
MCHC: 33.3 g/dL (ref 32.0–36.0)
MCV: 83.9 fL (ref 80.0–100.0)
Platelets: 181 10*3/uL (ref 150–440)
RBC: 4.47 MIL/uL (ref 4.40–5.90)
RDW: 14.7 % — ABNORMAL HIGH (ref 11.5–14.5)
WBC: 5.3 10*3/uL (ref 3.8–10.6)

## 2016-10-11 NOTE — Pre-Procedure Instructions (Signed)
Called Dr Fabio Asa office and notified them that pt needs cardiac clearance-I told receptionist that I am faxing the request to his cardiologist at [facility] but that Dr [facility] office needs to follow up with clearance-receptionist verbalized she would

## 2016-10-11 NOTE — Pre-Procedure Instructions (Signed)
Called Dr Kayleen Memos regarding abnormal EKG-Dr Kayleen Memos reviewed current EKG with 2013 EKG and significant changes have occurred per Dr Aletta Edouard needing cardiac clearance

## 2016-10-11 NOTE — Pre-Procedure Instructions (Signed)
RECEIVED CARDIAC CLEARANCE NOTE FROM-ON CHART

## 2016-10-14 ENCOUNTER — Encounter: Payer: Self-pay | Admitting: *Deleted

## 2016-10-14 NOTE — H&P (Signed)
NAME:  Jeff Boone, Jeff Boone NO.:  [id number]  MEDICAL RECORD NO.:  [id number]  LOCATION:                                 FACILITY:  PHYSICIAN:  Maryan Puls          DATE OF BIRTH:  1945-05-08  DATE OF ADMISSION:  10/15/2016 DATE OF DISCHARGE:                            HISTORY AND PHYSICAL   SAME-DAY SURGERY:  October 15, 2016.  CHIEF COMPLAINT:  Right scrotal swelling and discomfort.  HISTORY OF PRESENT ILLNESS:  Mr. Maryjean Morn is a 71 year old Hispanic male with a 1 year history of right scrotal swelling.  He has a history of hydrocele that was treated with sclerotherapy in January, but the hydrocele recurred.  He comes in now for right hydrocelectomy.  ALLERGIES:  THE PATIENT WAS POSSIBLY ALLERGIC TO XYLOCAINE.  CURRENT MEDICATIONS:  Included metformin, prednisone, Eliquis, metoprolol, lisinopril, and tamsulosin.  SURGICAL HISTORY:  Pacemaker placement in 2015.  SOCIAL HISTORY:  The patient denied tobacco or alcohol use.  FAMILY HISTORY:  Negative for urological disease.  PAST AND CURRENT MEDICAL CONDITIONS: 1. Diabetes. 2. Prostate cancer, status post brachytherapy in 2010. 3. Unspecified cardiac arrhythmia, treated with pacemaker.  REVIEW OF SYSTEMS:  The patient denies chest pain, shortness of breath, diabetes, or stroke.  PHYSICAL EXAMINATION:  GENERAL:  A well-nourished Hispanic male, in no acute distress. HEENT:  Sclerae were clear. NECK:  Supple.  No palpable cervical adenopathy. LUNGS:  Clear to auscultation. CARDIOVASCULAR:  Regular rhythm and rate without audible murmurs. ABDOMEN:  Soft, nontender abdomen. GU:  The patient had a greater than 250 mL transilluminating right hydrocele.  Testes were smooth and nontender, 20 cc size each. RECTAL:  A 15 g smooth and nontender prostate. NEUROMUSCULAR:  Alert and oriented x3. NEUROLOGICAL:  Alert and oriented x3.  IMPRESSION:  Symptomatic right hydrocele.  PLAN:  Right hydrocelectomy.     ______________________________ Maryan Puls     MW/MEDQ  D:  10/14/2016  T:  10/14/2016  Job:  [id number]

## 2016-10-15 ENCOUNTER — Encounter
Admission: RE | Disposition: A | Discharge status: Home / Self Care | Payer: Self-pay | Source: Ambulatory Visit | Attending: Urology

## 2016-10-15 ENCOUNTER — Ambulatory Visit
Admission: RE | Admit: 2016-10-15 | Discharge: 2016-10-15 | Disposition: A | Discharge status: Home / Self Care | Payer: Medicare Other | Source: Ambulatory Visit | Attending: Urology | Admitting: Urology

## 2016-10-15 ENCOUNTER — Ambulatory Visit: Payer: Medicare Other | Admitting: Anesthesiology

## 2016-10-15 ENCOUNTER — Encounter: Payer: Self-pay | Admitting: *Deleted

## 2016-10-15 DIAGNOSIS — Z7901 Long term (current) use of anticoagulants: Secondary | ICD-10-CM | POA: Insufficient documentation

## 2016-10-15 DIAGNOSIS — I10 Essential (primary) hypertension: Secondary | ICD-10-CM | POA: Diagnosis not present

## 2016-10-15 DIAGNOSIS — Z87891 Personal history of nicotine dependence: Secondary | ICD-10-CM | POA: Diagnosis not present

## 2016-10-15 DIAGNOSIS — Z950 Presence of cardiac pacemaker: Secondary | ICD-10-CM | POA: Insufficient documentation

## 2016-10-15 DIAGNOSIS — K219 Gastro-esophageal reflux disease without esophagitis: Secondary | ICD-10-CM | POA: Diagnosis not present

## 2016-10-15 DIAGNOSIS — I2510 Atherosclerotic heart disease of native coronary artery without angina pectoris: Secondary | ICD-10-CM | POA: Diagnosis not present

## 2016-10-15 DIAGNOSIS — Z7984 Long term (current) use of oral hypoglycemic drugs: Secondary | ICD-10-CM | POA: Insufficient documentation

## 2016-10-15 DIAGNOSIS — E119 Type 2 diabetes mellitus without complications: Secondary | ICD-10-CM | POA: Insufficient documentation

## 2016-10-15 DIAGNOSIS — Z8546 Personal history of malignant neoplasm of prostate: Secondary | ICD-10-CM | POA: Insufficient documentation

## 2016-10-15 DIAGNOSIS — Z79899 Other long term (current) drug therapy: Secondary | ICD-10-CM | POA: Insufficient documentation

## 2016-10-15 DIAGNOSIS — N433 Hydrocele, unspecified: Secondary | ICD-10-CM | POA: Insufficient documentation

## 2016-10-15 HISTORY — PX: HYDROCELE EXCISION: SHX482

## 2016-10-15 LAB — GLUCOSE, CAPILLARY
Glucose-Capillary: 140 mg/dL — ABNORMAL HIGH (ref 65–99)
Glucose-Capillary: 140 mg/dL — ABNORMAL HIGH (ref 65–99)

## 2016-10-15 SURGERY — HYDROCELECTOMY
Anesthesia: General | Site: Scrotum | Laterality: Right | Wound class: Clean Contaminated

## 2016-10-15 MED ORDER — CEPHALEXIN 500 MG PO CAPS: 500.0000 mg | ORAL_CAPSULE | Freq: Four times a day (QID) | ORAL | 0 refills | Status: DC

## 2016-10-15 MED ORDER — EPHEDRINE SULFATE 50 MG/ML IJ SOLN
INTRAMUSCULAR | Status: AC
  Filled 2016-10-15: qty 1

## 2016-10-15 MED ORDER — LIDOCAINE HCL (PF) 1 % IJ SOLN
INTRAMUSCULAR | Status: AC
  Filled 2016-10-15: qty 30

## 2016-10-15 MED ORDER — LIDOCAINE HCL (PF) 2 % IJ SOLN
INTRAMUSCULAR | Status: AC
  Filled 2016-10-15: qty 2

## 2016-10-15 MED ORDER — ONDANSETRON HCL 4 MG/2ML IJ SOLN
INTRAMUSCULAR | Status: AC
  Filled 2016-10-15: qty 2

## 2016-10-15 MED ORDER — ACETAMINOPHEN 10 MG/ML IV SOLN
INTRAVENOUS | Status: DC | PRN
  Administered 2016-10-15: 1000 mg via INTRAVENOUS

## 2016-10-15 MED ORDER — FENTANYL CITRATE (PF) 100 MCG/2ML IJ SOLN
INTRAMUSCULAR | Status: AC
  Administered 2016-10-15: 25 ug via INTRAVENOUS
  Filled 2016-10-15: qty 2

## 2016-10-15 MED ORDER — EPHEDRINE SULFATE 50 MG/ML IJ SOLN: INTRAMUSCULAR | Status: DC | PRN

## 2016-10-15 MED ORDER — CEFAZOLIN SODIUM-DEXTROSE 1-4 GM/50ML-% IV SOLN
INTRAVENOUS | Status: AC
  Filled 2016-10-15: qty 50

## 2016-10-15 MED ORDER — LIDOCAINE HCL (CARDIAC) 20 MG/ML IV SOLN
INTRAVENOUS | Status: DC | PRN
  Administered 2016-10-15: 40 mg via INTRAVENOUS

## 2016-10-15 MED ORDER — BUPIVACAINE HCL (PF) 0.5 % IJ SOLN
INTRAMUSCULAR | Status: AC
  Filled 2016-10-15: qty 30

## 2016-10-15 MED ORDER — HYDROCODONE-ACETAMINOPHEN 7.5-325 MG PO TABS: 1.0000 | ORAL_TABLET | ORAL | 0 refills | Status: DC | PRN

## 2016-10-15 MED ORDER — FENTANYL CITRATE (PF) 100 MCG/2ML IJ SOLN
INTRAMUSCULAR | Status: AC
Start: 2016-10-15 — End: 2016-10-15
  Filled 2016-10-15: qty 2

## 2016-10-15 MED ORDER — KETOROLAC TROMETHAMINE 30 MG/ML IJ SOLN
INTRAMUSCULAR | Status: DC | PRN
  Administered 2016-10-15: 30 mg via INTRAVENOUS

## 2016-10-15 MED ORDER — CEFAZOLIN SODIUM-DEXTROSE 1-4 GM/50ML-% IV SOLN
INTRAVENOUS | Status: DC | PRN
  Administered 2016-10-15: 1 g via INTRAVENOUS

## 2016-10-15 MED ORDER — PROPOFOL 10 MG/ML IV BOLUS
INTRAVENOUS | Status: DC | PRN
  Administered 2016-10-15: 20 mg via INTRAVENOUS
  Administered 2016-10-15: 100 mg via INTRAVENOUS

## 2016-10-15 MED ORDER — MIDAZOLAM HCL 2 MG/2ML IJ SOLN
INTRAMUSCULAR | Status: AC
  Filled 2016-10-15: qty 2

## 2016-10-15 MED ORDER — SODIUM CHLORIDE 0.9 % IV SOLN
INTRAVENOUS | Status: DC
  Administered 2016-10-15 (×2): via INTRAVENOUS

## 2016-10-15 MED ORDER — KETOROLAC TROMETHAMINE 30 MG/ML IJ SOLN
INTRAMUSCULAR | Status: AC
  Filled 2016-10-15: qty 1

## 2016-10-15 MED ORDER — DOCUSATE SODIUM 100 MG PO CAPS
200.0000 mg | ORAL_CAPSULE | Freq: Two times a day (BID) | ORAL | 3 refills | Status: DC
Start: 2016-10-15 — End: 2018-05-07

## 2016-10-15 MED ORDER — ACETAMINOPHEN 10 MG/ML IV SOLN
INTRAVENOUS | Status: AC
  Filled 2016-10-15: qty 100

## 2016-10-15 MED ORDER — ONDANSETRON HCL 4 MG/2ML IJ SOLN
INTRAMUSCULAR | Status: DC | PRN
  Administered 2016-10-15: 4 mg via INTRAVENOUS

## 2016-10-15 MED ORDER — EPHEDRINE SULFATE 50 MG/ML IJ SOLN
INTRAMUSCULAR | Status: DC | PRN
  Administered 2016-10-15: 10 mg via INTRAVENOUS

## 2016-10-15 MED ORDER — FENTANYL CITRATE (PF) 100 MCG/2ML IJ SOLN
INTRAMUSCULAR | Status: DC | PRN
  Administered 2016-10-15: 50 ug via INTRAVENOUS
  Administered 2016-10-15 (×2): 25 ug via INTRAVENOUS

## 2016-10-15 MED ORDER — FENTANYL CITRATE (PF) 100 MCG/2ML IJ SOLN
25.0000 ug | INTRAMUSCULAR | Status: DC | PRN
Start: 2016-10-15 — End: 2016-10-15
  Administered 2016-10-15 (×4): 25 ug via INTRAVENOUS

## 2016-10-15 MED ORDER — ONDANSETRON HCL 4 MG/2ML IJ SOLN: 4.0000 mg | Freq: Once | INTRAMUSCULAR | Status: DC | PRN

## 2016-10-15 MED ORDER — PROPOFOL 10 MG/ML IV BOLUS
INTRAVENOUS | Status: AC
  Filled 2016-10-15: qty 20

## 2016-10-15 MED ORDER — MIDAZOLAM HCL 2 MG/2ML IJ SOLN
INTRAMUSCULAR | Status: DC | PRN
  Administered 2016-10-15: 1 mg via INTRAVENOUS

## 2016-10-15 SURGICAL SUPPLY — 32 items
BLADE CLIPPER SURG (BLADE) ×3 IMPLANT
BLADE SURG 15 STRL LF DISP TIS (BLADE) ×1 IMPLANT
BLADE SURG 15 STRL SS (BLADE) ×2
DERMABOND ADVANCED (GAUZE/BANDAGES/DRESSINGS) ×2
DERMABOND ADVANCED .7 DNX12 (GAUZE/BANDAGES/DRESSINGS) ×1 IMPLANT
DRAPE LAPAROTOMY 77X122 PED (DRAPES) ×3 IMPLANT
DRSG TELFA 4X3 1S NADH ST (GAUZE/BANDAGES/DRESSINGS) ×3 IMPLANT
ELECT CAUTERY NEEDLE TIP 1.0 (MISCELLANEOUS) ×3
ELECT REM PT RETURN 9FT ADLT (ELECTROSURGICAL) ×3
ELECTRODE CAUTERY NEDL TIP 1.0 (MISCELLANEOUS) ×1 IMPLANT
ELECTRODE REM PT RTRN 9FT ADLT (ELECTROSURGICAL) ×1 IMPLANT
GAUZE FLUFF 18X24 1PLY STRL (GAUZE/BANDAGES/DRESSINGS) ×3 IMPLANT
GLOVE BIO SURGEON STRL SZ7.5 (GLOVE) ×3 IMPLANT
GOWN STRL REUS W/ TWL LRG LVL3 (GOWN DISPOSABLE) ×2 IMPLANT
GOWN STRL REUS W/TWL LRG LVL3 (GOWN DISPOSABLE) ×4
GOWN STRL REUS W/TWL XL LVL3 (GOWN DISPOSABLE) ×3 IMPLANT
KIT RM TURNOVER STRD PROC AR (KITS) ×3 IMPLANT
LABEL OR SOLS (LABEL) ×3 IMPLANT
NS IRRIG 500ML POUR BTL (IV SOLUTION) ×3 IMPLANT
PACK BASIN MINOR ARMC (MISCELLANEOUS) ×3 IMPLANT
PREP PVP WINGED SPONGE (MISCELLANEOUS) ×3 IMPLANT
SOL PREP PVP 2OZ (MISCELLANEOUS) ×3
SOLUTION PREP PVP 2OZ (MISCELLANEOUS) ×1 IMPLANT
SUPPORETR ATHLETIC LG (MISCELLANEOUS) ×1 IMPLANT
SUPPORTER ATHLETIC LG (MISCELLANEOUS) ×3
SUT CHROMIC 3 0 SH 27 (SUTURE) ×9 IMPLANT
SUT PLAIN 3 0 SH 27IN (SUTURE) ×3 IMPLANT
SUT PLAIN 4 0 FS 2 27 (SUTURE) IMPLANT
SUT VIC AB 4-0 PS2 18 (SUTURE) ×3 IMPLANT
SUT VIC AB 4-0 SH 27 (SUTURE) ×4
SUT VIC AB 4-0 SH 27XANBCTRL (SUTURE) ×2 IMPLANT
SYR 50ML LL SCALE MARK (SYRINGE) ×3 IMPLANT

## 2016-10-15 NOTE — Anesthesia Preprocedure Evaluation (Signed)
Anesthesia Evaluation  Patient identified by MRN, date of birth, ID band Patient awake    Reviewed: Allergy & Precautions, H&P , NPO status , Patient's Chart, lab work & pertinent test results, reviewed documented beta blocker date and time   Airway Mallampati: II  TM Distance: >3 FB Neck ROM: full    Dental  (+) Teeth Intact   Pulmonary neg pulmonary ROS, former smoker,    Pulmonary exam normal        Cardiovascular Exercise Tolerance: Good hypertension, On Medications + CAD and + Past MI  negative cardio ROS Normal cardiovascular exam+ dysrhythmias + Cardiac Defibrillator  Rate:Normal     Neuro/Psych CVA, No Residual Symptoms negative neurological ROS  negative psych ROS   GI/Hepatic negative GI ROS, Neg liver ROS, GERD  Medicated,  Endo/Other  negative endocrine ROSdiabetes  Renal/GU negative Renal ROS  negative genitourinary   Musculoskeletal   Abdominal   Peds  Hematology negative hematology ROS (+)   Anesthesia Other Findings   Reproductive/Obstetrics negative OB ROS                             Anesthesia Physical Anesthesia Plan  ASA: III  Anesthesia Plan: General LMA   Post-op Pain Management:    Induction:   PONV Risk Score and Plan: 3 and Ondansetron, Dexamethasone, Midazolam and Propofol infusion  Airway Management Planned:   Additional Equipment:   Intra-op Plan:   Post-operative Plan:   Informed Consent: I have reviewed the patients History and Physical, chart, labs and discussed the procedure including the risks, benefits and alternatives for the proposed anesthesia with the patient or authorized representative who has indicated his/her understanding and acceptance.     Plan Discussed with: CRNA  Anesthesia Plan Comments:         Anesthesia Quick Evaluation

## 2016-10-15 NOTE — OR Nursing (Signed)
500 ml of amber colored fluid drained from hydrocele.

## 2016-10-15 NOTE — Op Note (Signed)
Preoperative diagnosis: Symptomatic right hydrocele  Postoperative diagnosis: Same   Procedure: Right hydrocelectomy    Surgeon: Otelia Limes. Yves Dill MD  Anesthesia: General  Indications:See the history and physical. After informed consent the above procedure(s) were requested     Technique and findings: After adequate general anesthesia been obtained the perineum was prepped and draped in the usual fashion. The patient had a large right hydrocele. Approximate 500 cc of clear yellow fluid was drained with an 18 [place] Angiocath. A 3 cm midline raphae scrotal incision was made and carried down to the hydrocele sac. Hydrocele sac was fully opened and further drained. The sac edges were then marsupialized posteriorly with running and locking 3-0 chromic suture. All bleeders were cauterized. The testicle was then placed back into the scrotal compartment and pexed in 3 locations with 3-0 chromic suture. Dartos fascia was then reapproximated with running 3-0 chromic suture. The skin was reapproximated with interrupted 3-0 chromic suture. Dermabond, sterile dressing and scrotal supporter were applied. The sponge needle and tract counts were noted be correct. Blood loss was minimal. The patient was then transferred to the recovery room in stable condition.

## 2016-10-15 NOTE — Discharge Instructions (Signed)
Hidrocele en los adultos (Hydrocele, Adult) El hidrocele es la acumulacin de lquido en la bolsa de piel flcida que aloja los testculos (escroto). Generalmente, afecta solo a un testculo. CAUSAS Esta afeccin puede ser causada por lo siguiente:  Una lesin en el escroto.  Una infeccin.  Un tumor o cncer en el testculo.  Torsin de Arts development officer.  Disminucin del flujo sanguneo Artist. SNTOMAS El hidrocele se siente como un globo lleno de [place]. Tambin puede sentirse pesado. El hidrocele puede causar lo siguiente:  Hinchazn del escroto. Esta puede disminuir al [employer].  Hinchazn de la ingle.  Molestia leve en el escroto.  Dolor. Este puede aparecer si el hidrocele fue causado por una infeccin o una torsin. DIAGNSTICO Esta afeccin se puede diagnosticar mediante la historia clnica, un examen fsico y estudios de diagnstico por imgenes. Tambin se pueden hacer anlisis de [place] y de [place] para determinar si hay infecciones. TRATAMIENTO El tratamiento puede incluir lo siguiente:  Therapist, sports, en especial si el hidrocele no causa sntomas.  Tratamiento de la afeccin preexistente. Esto puede incluir la administracin de antibiticos.  Ciruga para drenar el lquido. Algunas opciones quirrgicas incluyen lo siguiente: ? Aspiracin con aguja. Para este procedimiento, se [place] una aguja para drenar el lquido. ? Hidrocelectoma. Para este procedimiento, se realiza una incisin en el escroto para extirpar el saco de lquido. INSTRUCCIONES PARA EL CUIDADO EN EL HOGAR  Concurra a todas las visitas de control como se lo haya indicado el mdico. Esto es importante.  Vigile el hidrocele para detectar cualquier cambio.  Tome los medicamentos de venta libre y los recetados solamente como se lo haya indicado el mdico.  Si le recetaron un antibitico, tmelo como se lo haya indicado el mdico. No deje de usar el antibitico aunque la afeccin  mejore. SOLICITE ATENCIN MDICA SI:  La hinchazn del escroto o de la ingle empeora.  El hidrocele se enrojece, est duro o sensible al tacto, o Associate Professor.  Observa cambios en el hidrocele.  Tiene fiebre. Esta informacin no tiene Marine scientist el consejo del mdico. Asegrese de hacerle al mdico cualquier pregunta que tenga. Document Released: 12/16/2012 Document Revised: 07/12/2014 Document Reviewed: 02/21/2014 Elsevier Interactive Patient Education  2018 [employer]. Hydrocele, Adult A hydrocele is a collection of fluid in the loose pouch of skin that holds the testicles (scrotum). Usually, it affects only one testicle. What are the causes? This condition may be caused by:  An injury to the scrotum.  An infection.  A tumor or cancer of the testicle.  Twisting of a testicle.  Decreased blood flow to the scrotum.  What are the signs or symptoms? A hydrocele feels like a water-filled balloon. It may also feel heavy. A hydrocele can cause:  Swelling of the scrotum. The swelling may decrease when you lie down.  Swelling of the groin.  Mild discomfort in the scrotum.  Pain. This can develop if the hydrocele was caused by infection or twisting.  How is this diagnosed? This condition may be diagnosed with a medical history, physical exam, and imaging tests. You may also have blood and urine tests to check for infection. How is this treated? Treatment may include:  Watching and waiting, particularly if the hydrocele causes no symptoms.  Treatment of the underlying condition. This may include using antibiotic medicine.  Surgery to drain the fluid. Some surgical options include: ? Needle aspiration. For this procedure, a needle is used to drain fluid. ? Hydrocelectomy. For  this procedure, an incision is made in the scrotum to remove the fluid sac.  Follow these instructions at home:  Keep all follow-up visits as told by your health care provider. This is  important.  Watch the hydrocele for any changes.  Take over-the-counter and prescription medicines only as told by your health care provider.  If you were prescribed an antibiotic medicine, use it as told by your health care provider. Do not stop using the antibiotic even if your condition improves. Contact a health care provider if:  The swelling in your scrotum or groin gets worse.  The hydrocele becomes red, firm, tender to the touch, or painful.  You notice any changes in the hydrocele.  You have a fever. This information is not intended to replace advice given to you by your health care provider. Make sure you discuss any questions you have with your health care provider. Document Released: 08/15/2009 Document Revised: 08/03/2015 Document Reviewed: 02/21/2014 Elsevier Interactive Patient Education  2018 [place], cuidados posteriores (Hydrocelectomy, Care After) [place] prximas semanas. Estas indicaciones le proporcionan informacin acerca de cmo deber cuidarse despus del procedimiento. El mdico tambin podr darle instrucciones ms especficas. El tratamiento ha sido planificado segn las prcticas mdicas actuales, pero en algunos casos pueden ocurrir problemas. Comunquese con el mdico si tiene algn problema o dudas despus del procedimiento. QU ESPERAR DESPUS DEL PROCEDIMIENTO Despus del procedimiento, es normal que la bolsa que contiene los testculos (escroto) est adolorida, hinchada y con hematomas. INSTRUCCIONES PARA EL CUIDADO EN EL HOGAR El bao  Pregntele al mdico cundo puede comenzar a baarse o Social worker.  Si le indicaron que use un soporte deportivo, squeselo cuando se bae. Cuidado de la incisin  Siga las indicaciones del mdico acerca del cuidado de la incisin. Haga lo siguiente: ? Lvese las manos con agua y jabn antes de Quarry manager las vendas (vendaje). Use desinfectante para manos si no dispone de  [place] y [place]. ? Cambie el vendaje como se lo haya indicado el mdico. ? No retire los puntos (suturas).  Controle la incisin y el escroto todos los das para detectar signos de infeccin. Est atento a los siguientes signos: ? Aumento del enrojecimiento, la hinchazn o Conservation officer, historic buildings. ? Hemorragias o secreciones. ? Calor. ? Pus o mal olor. Control del dolor, la rigidez y la hinchazn  Si se lo indican, aplique hielo sobre la zona lesionada: ? Field seismologist hielo en una bolsa plstica. ? Coloque una [employer] piel y la bolsa de hielo. ? Coloque el hielo durante 12minutos, 2 a 3veces por da. Conducir  No conduzca durante 24horas si le administraron un sedante.  No conduzca ni opere maquinaria pesada mientras toma analgsicos recetados.  Pregntele al mdico cundo es seguro volver a Forensic psychologist. Actividad  No realice ninguna actividad que demande un gran esfuerzo y Teacher, early years/pre (que sean intensas) por el tiempo que le haya indicado el mdico.  Reanude sus actividades normales como se lo haya indicado el mdico. Pregntele al mdico qu actividades son seguras para usted.  No levante objetos que pesen ms de 10libras (4,5kg) hasta que el mdico le diga que es seguro. Instrucciones generales  [employer] de venta libre y los recetados solamente como se lo haya indicado el mdico.  Consulting civil engineer a todas las visitas de control como se lo haya indicado el mdico. Esto es importante.  Si le indicaron que use un soporte deportivo, selo como se lo haya indicado el mdico.  Si  le colocaron un drenaje durante el procedimiento, deber volver para que se lo retiren. SOLICITE ATENCIN MDICA SI:  El dolor empeora.  Tiene ms enrojecimiento, hinchazn o dolor alrededor del escroto.  Observa lquido o sangre que salen del escroto.  La incisin est caliente al tacto.  Tiene pus o percibe mal olor que proviene del escroto.  Tiene fiebre. Esta informacin no tiene Marine scientist  el consejo del mdico. Asegrese de hacerle al mdico cualquier pregunta que tenga. Document Released: 02/06/2015 Elsevier Interactive Patient Education  2017 [place], Adult, Care After This sheet gives you information about how to care for yourself after your procedure. Your health care provider may also give you more specific instructions. If you have problems or questions, contact your health care provider. What can I expect after the procedure? After your procedure, it is common to have mild discomfort, swelling, and bruising in the pouch that holds your testicles (scrotum). Follow these instructions at home: Bathing  Ask your health care provider when you can shower, take baths, or go swimming.  If you were told to wear an athletic support strap, take it off when you shower or take a bath. Incision care  Follow instructions from your health care provider about how to take care of your incision. Make sure you: ? Wash your hands with soap and water before you change your bandage (dressing). If soap and water are not available, use hand sanitizer. ? Change your dressing as told by your health care provider. ? Leave stitches (sutures) in place.  Check your incision and scrotum every day for signs of infection. Check for: ? More redness, swelling, or pain. ? Blood or fluid. ? Warmth. ? Pus or a bad smell. Managing pain, stiffness, and swelling  If directed, apply ice to the injured area: ? Put ice in a plastic bag. ? Place a towel between your skin and the bag. ? Leave the ice on for 20 minutes, 2-3 times per day. Driving  Do not drive for 24 hours if you were given a sedative.  Do not drive or use heavy machinery while taking prescription pain medicine.  Ask your health care provider when it is safe to drive. Activity  Do not do any activities that require great strength and energy (are vigorous) for as long as told by your health care  provider.  Return to your normal activities as told by your health care provider. Ask your health care provider what activities are safe for you.  Do not lift anything that is heavier than 10 lb (4.5 kg) until your health care provider says that it is safe. General instructions  Take over-the-counter and prescription medicines only as told by your health care provider.  Keep all follow-up visits as told by your health care provider. This is important.  If you were given an athletic support strap, wear it as told by your health care provider.  If you had a drain put in during the procedure, you will need to return for a follow-up visit to have it removed. Contact a health care provider if:  Your pain is not controlled with medicine.  You have more redness or swelling around your scrotum.  You have blood or fluid coming from your scrotum.  Your incision feels warm to the touch.  You have pus or a bad smell coming from your scrotum.  You have a fever. This information is not intended to replace advice given to you by your health care  provider. Make sure you discuss any questions you have with your health care provider. Document Released: 11/16/2014 Document Revised: 11/25/2015 Document Reviewed: 11/25/2015 Elsevier Interactive Patient Education  [employer].

## 2016-10-15 NOTE — Transfer of Care (Signed)
Immediate Anesthesia Transfer of Care Note  Patient: Jeff Boone  Procedure(s) Performed: Procedure(s): HYDROCELECTOMY ADULT (Right)  Patient Location: PACU  Anesthesia Type:General  Level of Consciousness: awake  Airway & Oxygen Therapy: Patient Spontanous Breathing  Post-op Assessment: Post -op Vital signs reviewed and stable  Post vital signs: stable  Last Vitals:  Vitals:   10/15/16 1617 10/15/16 1656  BP: (!) 147/67 127/66  Pulse: (!) 54 (!) 52  Resp: 16   Temp:      Last Pain:  Vitals:   10/15/16 1610  TempSrc:   PainSc: 2       Patients Stated Pain Goal: 3 (38/87/19 [id number])  Complications: No apparent anesthesia complications

## 2016-10-15 NOTE — Anesthesia Post-op Follow-up Note (Signed)
Anesthesia QCDR form completed.        

## 2016-10-15 NOTE — H&P (Signed)
Date of Initial H&P: 10/14/16  History reviewed, patient examined, no change in status, stable for surgery. 

## 2016-10-16 ENCOUNTER — Encounter: Payer: Self-pay | Admitting: Urology

## 2016-10-29 NOTE — Anesthesia Postprocedure Evaluation (Signed)
Anesthesia Post Note  Patient: Jeff Boone  Procedure(s) Performed: Procedure(s) (LRB): HYDROCELECTOMY ADULT (Right)  Patient location during evaluation: PACU Anesthesia Type: General Level of consciousness: awake and alert Pain management: pain level controlled Vital Signs Assessment: post-procedure vital signs reviewed and stable Respiratory status: spontaneous breathing, nonlabored ventilation, respiratory function stable and patient connected to nasal cannula oxygen Cardiovascular status: blood pressure returned to baseline and stable Postop Assessment: no signs of nausea or vomiting Anesthetic complications: no     Last Vitals:  Vitals:   10/15/16 1617 10/15/16 1656  BP: (!) 147/67 127/66  Pulse: (!) 54 (!) 52  Resp: 16   Temp:    SpO2: 98% 99%    Last Pain:  Vitals:   10/16/16 0812  TempSrc:   PainSc: 1                  Molli Barrows

## 2018-05-07 ENCOUNTER — Other Ambulatory Visit: Payer: Self-pay

## 2018-05-07 ENCOUNTER — Observation Stay
Admission: EM | Admit: 2018-05-07 | Discharge: 2018-05-08 | Disposition: A | Discharge status: Home / Self Care | Payer: Medicare Other | Attending: Internal Medicine | Admitting: Internal Medicine

## 2018-05-07 ENCOUNTER — Emergency Department: Payer: Medicare Other

## 2018-05-07 DIAGNOSIS — Z4502 Encounter for adjustment and management of automatic implantable cardiac defibrillator: Secondary | ICD-10-CM | POA: Insufficient documentation

## 2018-05-07 DIAGNOSIS — I2510 Atherosclerotic heart disease of native coronary artery without angina pectoris: Secondary | ICD-10-CM | POA: Insufficient documentation

## 2018-05-07 DIAGNOSIS — Z7901 Long term (current) use of anticoagulants: Secondary | ICD-10-CM | POA: Diagnosis not present

## 2018-05-07 DIAGNOSIS — Z95810 Presence of automatic (implantable) cardiac defibrillator: Secondary | ICD-10-CM | POA: Diagnosis not present

## 2018-05-07 DIAGNOSIS — K219 Gastro-esophageal reflux disease without esophagitis: Secondary | ICD-10-CM | POA: Diagnosis not present

## 2018-05-07 DIAGNOSIS — Z87891 Personal history of nicotine dependence: Secondary | ICD-10-CM | POA: Insufficient documentation

## 2018-05-07 DIAGNOSIS — Z6821 Body mass index (BMI) 21.0-21.9, adult: Secondary | ICD-10-CM | POA: Insufficient documentation

## 2018-05-07 DIAGNOSIS — E43 Unspecified severe protein-calorie malnutrition: Secondary | ICD-10-CM | POA: Insufficient documentation

## 2018-05-07 DIAGNOSIS — Z7952 Long term (current) use of systemic steroids: Secondary | ICD-10-CM | POA: Insufficient documentation

## 2018-05-07 DIAGNOSIS — Z79899 Other long term (current) drug therapy: Secondary | ICD-10-CM | POA: Insufficient documentation

## 2018-05-07 DIAGNOSIS — Z7982 Long term (current) use of aspirin: Secondary | ICD-10-CM | POA: Insufficient documentation

## 2018-05-07 DIAGNOSIS — I252 Old myocardial infarction: Secondary | ICD-10-CM | POA: Insufficient documentation

## 2018-05-07 DIAGNOSIS — I10 Essential (primary) hypertension: Secondary | ICD-10-CM | POA: Insufficient documentation

## 2018-05-07 DIAGNOSIS — I498 Other specified cardiac arrhythmias: Principal | ICD-10-CM | POA: Insufficient documentation

## 2018-05-07 DIAGNOSIS — Z8546 Personal history of malignant neoplasm of prostate: Secondary | ICD-10-CM | POA: Diagnosis not present

## 2018-05-07 DIAGNOSIS — I480 Paroxysmal atrial fibrillation: Secondary | ICD-10-CM | POA: Insufficient documentation

## 2018-05-07 DIAGNOSIS — R778 Other specified abnormalities of plasma proteins: Secondary | ICD-10-CM

## 2018-05-07 DIAGNOSIS — E559 Vitamin D deficiency, unspecified: Secondary | ICD-10-CM | POA: Diagnosis not present

## 2018-05-07 DIAGNOSIS — Z881 Allergy status to other antibiotic agents status: Secondary | ICD-10-CM | POA: Insufficient documentation

## 2018-05-07 DIAGNOSIS — Z888 Allergy status to other drugs, medicaments and biological substances status: Secondary | ICD-10-CM | POA: Insufficient documentation

## 2018-05-07 DIAGNOSIS — R7989 Other specified abnormal findings of blood chemistry: Secondary | ICD-10-CM | POA: Insufficient documentation

## 2018-05-07 DIAGNOSIS — Z794 Long term (current) use of insulin: Secondary | ICD-10-CM | POA: Insufficient documentation

## 2018-05-07 DIAGNOSIS — I420 Dilated cardiomyopathy: Secondary | ICD-10-CM | POA: Insufficient documentation

## 2018-05-07 DIAGNOSIS — E119 Type 2 diabetes mellitus without complications: Secondary | ICD-10-CM | POA: Diagnosis not present

## 2018-05-07 DIAGNOSIS — I255 Ischemic cardiomyopathy: Secondary | ICD-10-CM | POA: Insufficient documentation

## 2018-05-07 DIAGNOSIS — Z955 Presence of coronary angioplasty implant and graft: Secondary | ICD-10-CM | POA: Diagnosis not present

## 2018-05-07 DIAGNOSIS — I472 Ventricular tachycardia, unspecified: Secondary | ICD-10-CM

## 2018-05-07 DIAGNOSIS — Z8673 Personal history of transient ischemic attack (TIA), and cerebral infarction without residual deficits: Secondary | ICD-10-CM | POA: Insufficient documentation

## 2018-05-07 DIAGNOSIS — I499 Cardiac arrhythmia, unspecified: Secondary | ICD-10-CM | POA: Diagnosis present

## 2018-05-07 LAB — CBC
HCT: 36.1 % — ABNORMAL LOW (ref 39.0–52.0)
HEMOGLOBIN: 11.7 g/dL — AB (ref 13.0–17.0)
MCH: 27 pg (ref 26.0–34.0)
MCHC: 32.4 g/dL (ref 30.0–36.0)
MCV: 83.2 fL (ref 80.0–100.0)
PLATELETS: 257 10*3/uL (ref 150–400)
RBC: 4.34 MIL/uL (ref 4.22–5.81)
RDW: 14.6 % (ref 11.5–15.5)
WBC: 5 10*3/uL (ref 4.0–10.5)
nRBC: 0 % (ref 0.0–0.2)

## 2018-05-07 LAB — COMPREHENSIVE METABOLIC PANEL
ALT: 66 U/L — ABNORMAL HIGH (ref 0–44)
ANION GAP: 6 (ref 5–15)
AST: 43 U/L — ABNORMAL HIGH (ref 15–41)
Albumin: 3.6 g/dL (ref 3.5–5.0)
Alkaline Phosphatase: 58 U/L (ref 38–126)
BILIRUBIN TOTAL: 0.7 mg/dL (ref 0.3–1.2)
BUN: 14 mg/dL (ref 8–23)
CO2: 27 mmol/L (ref 22–32)
CREATININE: 0.7 mg/dL (ref 0.61–1.24)
Calcium: 8.4 mg/dL — ABNORMAL LOW (ref 8.9–10.3)
Chloride: 102 mmol/L (ref 98–111)
GFR calc non Af Amer: >60 mL/min (ref >60)
GLUCOSE: 225 mg/dL — AB (ref 70–99)
Potassium: 4.2 mmol/L (ref 3.5–5.1)
Sodium: 135 mmol/L (ref 135–145)
TOTAL PROTEIN: 6.5 g/dL (ref 6.5–8.1)

## 2018-05-07 LAB — TROPONIN I
TROPONIN I: 2.21 ng/mL — AB (ref <0.03)
Troponin I: 2.29 ng/mL (ref <0.03)

## 2018-05-07 LAB — GLUCOSE, CAPILLARY: Glucose-Capillary: 130 mg/dL — ABNORMAL HIGH (ref 70–99)

## 2018-05-07 MED ORDER — ENOXAPARIN SODIUM 40 MG/0.4ML ~~LOC~~ SOLN: 40.0000 mg | SUBCUTANEOUS | Status: DC

## 2018-05-07 MED ORDER — ONDANSETRON HCL 4 MG/2ML IJ SOLN: 4.0000 mg | Freq: Four times a day (QID) | INTRAMUSCULAR | Status: DC | PRN

## 2018-05-07 MED ORDER — ALBUTEROL SULFATE (2.5 MG/3ML) 0.083% IN NEBU: 2.5000 mg | INHALATION_SOLUTION | RESPIRATORY_TRACT | Status: DC | PRN

## 2018-05-07 MED ORDER — TAMSULOSIN HCL 0.4 MG PO CAPS
0.4000 mg | ORAL_CAPSULE | Freq: Every day | ORAL | Status: DC
  Administered 2018-05-08: 0.4 mg via ORAL
  Filled 2018-05-07: qty 1

## 2018-05-07 MED ORDER — METFORMIN HCL 500 MG PO TABS
1000.0000 mg | ORAL_TABLET | Freq: Two times a day (BID) | ORAL | Status: DC
  Administered 2018-05-08: 1000 mg via ORAL
  Filled 2018-05-07: qty 2

## 2018-05-07 MED ORDER — ADULT MULTIVITAMIN W/MINERALS CH
1.0000 | ORAL_TABLET | Freq: Every day | ORAL | Status: DC
  Administered 2018-05-08: 1 via ORAL
  Filled 2018-05-07: qty 1

## 2018-05-07 MED ORDER — LISINOPRIL 5 MG PO TABS
2.5000 mg | ORAL_TABLET | Freq: Every day | ORAL | Status: DC
  Administered 2018-05-08: 2.5 mg via ORAL
  Filled 2018-05-07: qty 1

## 2018-05-07 MED ORDER — EZETIMIBE 10 MG PO TABS
10.0000 mg | ORAL_TABLET | Freq: Every day | ORAL | Status: DC
  Administered 2018-05-08: 10 mg via ORAL
  Filled 2018-05-07: qty 1

## 2018-05-07 MED ORDER — ACETAMINOPHEN 325 MG PO TABS: 650.0000 mg | ORAL_TABLET | Freq: Four times a day (QID) | ORAL | Status: DC | PRN

## 2018-05-07 MED ORDER — ONDANSETRON HCL 4 MG PO TABS: 4.0000 mg | ORAL_TABLET | Freq: Four times a day (QID) | ORAL | Status: DC | PRN

## 2018-05-07 MED ORDER — RIVAROXABAN 20 MG PO TABS: 20.0000 mg | ORAL_TABLET | Freq: Every day | ORAL | Status: DC

## 2018-05-07 MED ORDER — METOPROLOL SUCCINATE ER 50 MG PO TB24
25.0000 mg | ORAL_TABLET | ORAL | Status: DC
  Administered 2018-05-08: 25 mg via ORAL
  Filled 2018-05-07: qty 1

## 2018-05-07 MED ORDER — ACETAMINOPHEN 650 MG RE SUPP: 650.0000 mg | Freq: Four times a day (QID) | RECTAL | Status: DC | PRN

## 2018-05-07 MED ORDER — POLYETHYLENE GLYCOL 3350 17 G PO PACK: 17.0000 g | PACK | Freq: Every day | ORAL | Status: DC | PRN

## 2018-05-07 MED ORDER — PANTOPRAZOLE SODIUM 40 MG PO TBEC
40.0000 mg | DELAYED_RELEASE_TABLET | Freq: Every day | ORAL | Status: DC
  Administered 2018-05-08: 40 mg via ORAL
  Filled 2018-05-07: qty 1

## 2018-05-07 MED ORDER — PREDNISONE 2.5 MG PO TABS
2.5000 mg | ORAL_TABLET | Freq: Every day | ORAL | Status: DC
  Administered 2018-05-08: 2.5 mg via ORAL
  Filled 2018-05-07: qty 1

## 2018-05-07 MED ORDER — INSULIN ASPART 100 UNIT/ML ~~LOC~~ SOLN
0.0000 [IU] | Freq: Three times a day (TID) | SUBCUTANEOUS | Status: DC
  Administered 2018-05-08: 2 [IU] via SUBCUTANEOUS
  Administered 2018-05-08: 1 [IU] via SUBCUTANEOUS
  Filled 2018-05-07 (×2): qty 1

## 2018-05-07 MED ORDER — ASPIRIN EC 81 MG PO TBEC
81.0000 mg | DELAYED_RELEASE_TABLET | Freq: Every day | ORAL | Status: DC
  Administered 2018-05-08: 81 mg via ORAL
  Filled 2018-05-07: qty 1

## 2018-05-07 MED ORDER — SODIUM CHLORIDE 0.9 % IV BOLUS
1000.0000 mL | Freq: Once | INTRAVENOUS | Status: AC
  Administered 2018-05-07: 1000 mL via INTRAVENOUS

## 2018-05-07 NOTE — Progress Notes (Signed)
Family Meeting Note  Patient came in after having a spell of dizziness and diaphoresis along with chest pain and apparently had ventricular arrhythmia noted on AICD interrogation which had shocked patient at home. His troponin is elevated which is expected after AICD shock. Patient is being admitted for overnight observation. He has comorbidities with diabetes hypertension and cardiomyopathy.  Code status address patient is a full code. Time spent during discussion 16 minutes Fritzi Mandes, MD

## 2018-05-07 NOTE — ED Notes (Signed)
Pt resting comfortably with family at bedside, awaiting results.

## 2018-05-07 NOTE — ED Notes (Signed)
Lav/Blue/Grn tubes sent to lab.

## 2018-05-07 NOTE — ED Notes (Signed)
EKG completed

## 2018-05-07 NOTE — ED Notes (Signed)
Report called to [facility] RN, pt admitted to 244.

## 2018-05-07 NOTE — H&P (Signed)
Jeff Boone at [place] NAME: Jeff Boone    MR#:  [id number]  DATE OF BIRTH:  [birth year]  DATE OF ADMISSION:  05/07/2018  PRIMARY CARE PHYSICIAN: Jeff Pitch, MD   REQUESTING/REFERRING PHYSICIAN: Dr. Kerman Passey  CHIEF COMPLAINT:   Dizziness followed by chest pain today HISTORY OF PRESENT ILLNESS:  Jeff Boone  is a 73 y.o. male with a known history of ischemic cardiomyopathy EF of 35% by last echo, history of atrial fibrillation on Xarelto, AICD, CAD, hypertension and diabetes comes to the emergency room after he started feeling dizziness while walking his dog in the backyard. Patient a pop sound in his chest and he describes as his heart was going to come out. He became very pale according to the daughter called EMS brought to the emergency room. He was found to have troponin of 2.19. His AICD was interrogated and was noted to have an episode of ventricular tachycardia status post defibrillator shock.  Patient denies any chest pain. ER physician spoke with cardiology Dr. Lourena Simmonds.  Patient is being admitted for further evaluation management.  PAST MEDICAL HISTORY:   Past Medical History:  Diagnosis Date  . AICD (automatic cardioverter/defibrillator) present    MEDTRONIC  . 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 Arthritis   . Atrial fibrillation (Jeff Boone)   . Cardiogenic shock (Jeff Boone)   . Coronary artery disease   . Diabetes mellitus without complication (Jeff Boone)    Pt takes Metformin  . Dysrhythmia   . GERD (gastroesophageal reflux disease)   . History of kidney stones   . Hypertension   . Prostate cancer (Jeff Boone) 2010  . STEMI (ST elevation myocardial infarction) (Jeff Boone) 03/2012  . Urolithiasis   . Vitamin D deficiency     PAST SURGICAL HISTOIRY:   Past Surgical History:  Procedure Laterality Date  . COLONOSCOPY WITH  PROPOFOL N/A 12/12/2014   Procedure: COLONOSCOPY WITH PROPOFOL;  Surgeon: Hulen Luster, MD;  Location: [facility] ENDOSCOPY;  Service: Gastroenterology;  Laterality: N/A;  . CORONARY ANGIOPLASTY WITH STENT PLACEMENT  2014  . HYDROCELE EXCISION Right 10/15/2016   Procedure: HYDROCELECTOMY ADULT;  Surgeon: Royston Cowper, MD;  Location: [facility] ORS;  Service: Urology;  Laterality: Right;  . ICD IMPLANT    . INSERTION PROSTATE RADIATION SEED  2010    SOCIAL HISTORY:   Social History   Tobacco Use  . Smoking status: Former Smoker    Years: 2.00    Types: Cigarettes  . Smokeless tobacco: Never Used  . Tobacco comment: only smoked socially  Substance Use Topics  . Alcohol use: No    FAMILY HISTORY:  No family history on file.  DRUG ALLERGIES:   Allergies  Allergen Reactions  . Statins     Muscle pain   . Xylocaine [Lidocaine]     Dizziness   . Azithromycin Rash    REVIEW OF SYSTEMS:  Review of Systems  Constitutional: Negative for chills, fever and weight loss.  HENT: Negative for ear discharge, ear pain and nosebleeds.   Eyes: Negative for blurred vision, pain and discharge.  Respiratory: Negative for sputum production, shortness of breath, wheezing and stridor.   Cardiovascular: Negative for chest pain, palpitations, orthopnea and PND.  Gastrointestinal: Negative for abdominal pain, diarrhea, nausea and vomiting.  Genitourinary: Negative for frequency and urgency.  Musculoskeletal: Negative for back pain and joint pain.  Neurological: Positive for dizziness and weakness. Negative for sensory change, speech change and focal weakness.  Psychiatric/Behavioral: Negative for depression and hallucinations. The patient is not nervous/anxious.      MEDICATIONS AT HOME:   Med reconciliation pending    VITAL SIGNS:  Blood pressure 130/84, pulse 80, resp. rate 14, height 5\' 5"  (1.651 m), weight 59.4 kg, SpO2 97 %.  PHYSICAL EXAMINATION:  GENERAL:  73 y.o.-year-old patient lying in the  bed with no acute distress.  EYES: Pupils equal, round, reactive to light and accommodation. No scleral icterus. Extraocular muscles intact.  HEENT: Head atraumatic, normocephalic. Oropharynx and nasopharynx clear.  NECK:  Supple, no jugular venous distention. No thyroid enlargement, no tenderness.  LUNGS: Normal breath sounds bilaterally, no wheezing, rales,rhonchi or crepitation. No use of accessory muscles of respiration.  CARDIOVASCULAR: S1, S2 normal. No murmurs, rubs, or gallops.  ABDOMEN: Soft, nontender, nondistended. Bowel sounds present. No organomegaly or mass.  EXTREMITIES: No pedal edema, cyanosis, or clubbing.  NEUROLOGIC: Cranial nerves II through XII are intact. Muscle strength 5/5 in all extremities. Sensation intact. Gait not checked.  PSYCHIATRIC: The patient is alert and oriented x 3.  SKIN: No obvious rash, lesion, or ulcer.   LABORATORY PANEL:   CBC Recent Labs  Lab 05/07/18 1709  WBC 5.0  HGB 11.7*  HCT 36.1*  PLT 257   ------------------------------------------------------------------------------------------------------------------  Chemistries  Recent Labs  Lab 05/07/18 1709  NA 135  K 4.2  CL 102  CO2 27  GLUCOSE 225*  BUN 14  CREATININE 0.70  CALCIUM 8.4*  AST 43*  ALT 66*  ALKPHOS 58  BILITOT 0.7   ------------------------------------------------------------------------------------------------------------------  Cardiac Enzymes Recent Labs  Lab 05/07/18 1709  TROPONINI 2.29*   ------------------------------------------------------------------------------------------------------------------  RADIOLOGY:  Dg Chest Portable 1 View  Result Date: 05/07/2018 CLINICAL DATA:  Pt in via EMS from home d/t dizziness AND defib shock from ICD. First time pt has ever been shocked from it. EXAM: PORTABLE CHEST 1 VIEW COMPARISON:  CT, 07/02/2012 FINDINGS: Cardiac silhouette is normal in size. No mediastinal or hilar masses. There is no evidence of  adenopathy. Left anterior chest wall AICD has its lead projecting in the right ventricle. Clear lungs.  No pleural effusion or pneumothorax. Skeletal structures are grossly intact. IMPRESSION: No active disease. Electronically Signed   By: Lajean Manes M.D.   On: 05/07/2018 20:44    EKG:   Sinus tachycardia IMPRESSION AND PLAN:   Jeff Boone  is a 73 y.o. male with a known history of ischemic cardiomyopathy EF of 35% by last echo, history of atrial fibrillation on Xarelto, AICD, CAD, hypertension and diabetes comes to the emergency room after he started feeling dizziness while walking his dog in the backyard. Patient a pop sound in his chest and he describes as his heart was going to come out. He became very pale according to the daughter called EMS brought to the emergency room. He was found to have troponin of 2.19  1. ventricular arrhythmia status post defibrillator shock -admit to telemetry -cycle cardiac enzymes times three -cardiology consultation. Dr. Lourena Simmonds aware -continue Xarelto. Elevated troponin likely due to AICD/defibrillator shock -hold off IV heparin drip per cardiology. Patient is chest pain free. No acute EKG changes.  2. Ischemic cardiomyopathy EF of 35% per last echo in 2014 -follows cardiology at [place] cardiac meds  3. Hypertension -continue home meds  4. Type II diabetes sliding scale insulin and home meds  5. DVT prophylaxis already  on oral anticoagulation  Discussed with patient and daughter   All the records are reviewed and case discussed with ED provider.   CODE STATUS: full TOTAL TIME TAKING CARE OF THIS PATIENT: 50 minutes.    Fritzi Mandes M.D on 05/07/2018 at 8:58 PM  Between 7am to 6pm - Pager - [phone]  After 6pm go to [email or web address] - password [email or web address] [email or web address]  [facility]  Office  [phone]  CC: Primary care physician; Jeff Pitch, MD

## 2018-05-07 NOTE — ED Notes (Signed)
Pacemaker interrogated per MD order.

## 2018-05-07 NOTE — ED Triage Notes (Signed)
Pt in via EMS from home d/t dizziness & defib shock from ICD. First time pt has ever been shocked from it. A&Ox4. 144/78; HR 100; ICD placed in 2015. Hist stroke/MI. Takes metformin. 18g L ac placed by EMS.

## 2018-05-07 NOTE — ED Provider Notes (Addendum)
[facility] Emergency Department Provider Note  Time seen: 6:32 PM  I have reviewed the triage vital signs and the nursing notes.   HISTORY  Chief Complaint Near Syncope    HPI Jeff Boone is a 73 y.o. male with a past medical history of arthritis, atrial fibrillation, cardiogenic shock status post AICD, gastric reflux, hypertension, previous MI, presents to the emergency department after an episode of dizziness with AICD defibrillation.  According to the patient just prior to arrival patient was at home when he began feeling somewhat dizzy and lightheaded and felt a pop in his chest consistent with defibrillator going off.  Patient states the defibrillator was placed 5 years ago he has never had it go off before.  Denies any chest pain or shortness of breath at any point.  Denies any symptoms at this time.  Patient follows up with [facility].   Past Medical History:  Diagnosis Date  . AICD (automatic cardioverter/defibrillator) present    MEDTRONIC  . 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 Arthritis   . Atrial fibrillation ([place])   . Cardiogenic shock ([place])   . Coronary artery disease   . Diabetes mellitus without complication ([place])    Pt takes Metformin  . Dysrhythmia   . GERD (gastroesophageal reflux disease)   . History of kidney stones   . Hypertension   . Prostate cancer ([place]) 2010  . STEMI (ST elevation myocardial infarction) ([place]) 03/2012  . Urolithiasis   . Vitamin D deficiency     There are no active problems to display for this patient.   Past Surgical History:  Procedure Laterality Date  . COLONOSCOPY WITH PROPOFOL N/A 12/12/2014   Procedure: COLONOSCOPY WITH PROPOFOL;  Surgeon: Hulen Luster, MD;  Location: [facility] ENDOSCOPY;  Service: Gastroenterology;  Laterality: N/A;  . CORONARY ANGIOPLASTY WITH STENT PLACEMENT  2014  . HYDROCELE  EXCISION Right 10/15/2016   Procedure: HYDROCELECTOMY ADULT;  Surgeon: Royston Cowper, MD;  Location: [facility] ORS;  Service: Urology;  Laterality: Right;  . ICD IMPLANT    . INSERTION PROSTATE RADIATION SEED  2010    Prior to Admission medications   Medication Sig Start Date End Date Taking? Authorizing Provider  apixaban (ELIQUIS) 5 MG TABS tablet Take 5 mg by mouth 2 (two) times daily.    [provider]  aspirin EC 81 MG tablet Take 81 mg by mouth daily.    [provider]  cephALEXin (KEFLEX) 500 MG capsule Take 1 capsule (500 mg total) by mouth 4 (four) times daily. 10/15/16   Royston Cowper, MD  Cholecalciferol (VITAMIN D3) 3000 units TABS Take 3,000 Units by mouth every other day.    [provider]  docusate sodium (COLACE) 100 MG capsule Take 2 capsules (200 mg total) by mouth 2 (two) times daily. 10/15/16   Royston Cowper, MD  HYDROcodone-acetaminophen (NORCO) 7.5-325 MG tablet Take 1-2 tablets by mouth every 4 (four) hours as needed for moderate pain. Maximum dose per 24 hours - 8 pills 10/15/16   Royston Cowper, MD  lisinopril (PRINIVIL,ZESTRIL) 2.5 MG tablet Take 2.5 mg by mouth daily as needed (high blood pressure).     [provider]  metFORMIN (GLUCOPHAGE) 1000 MG tablet Take 1,000 mg by mouth 2 (two) times daily.    [provider]  metoprolol succinate (TOPROL-XL) 25 MG 24 hr tablet Take  25 mg by mouth every morning.     [provider]  omeprazole (PRILOSEC) 20 MG capsule Take 20 mg by mouth daily as needed (acid reflux).     [provider]  predniSONE (DELTASONE) 5 MG tablet Take 2.5 mg by mouth every other day.     [provider]  tamsulosin (FLOMAX) 0.4 MG CAPS capsule Take 0.4 mg by mouth at bedtime.     [provider]    Allergies  Allergen Reactions  . Statins     Muscle pain   . Xylocaine [Lidocaine]     Dizziness   . Azithromycin Rash    No family history on file.  Social  History Social History   Tobacco Use  . Smoking status: Former Smoker    Years: 2.00    Types: Cigarettes  . Smokeless tobacco: Never Used  . Tobacco comment: only smoked socially  Substance Use Topics  . Alcohol use: No  . Drug use: No    Review of Systems Constitutional: Negative for fever.  Positive for dizziness which has since resolved. Cardiovascular: Negative for chest pain.  Positive for defibrillation. Respiratory: Negative for shortness of breath. Gastrointestinal: Negative for abdominal pain, vomiting and diarrhea. Genitourinary: Negative for urinary compaints Musculoskeletal: Negative for musculoskeletal complaints Skin: Negative for skin complaints  Neurological: Negative for headache All other ROS negative  ____________________________________________   PHYSICAL EXAM:  VITAL SIGNS: ED Triage Vitals  Enc Vitals Group     BP 05/07/18 1659 (!) 152/87     Pulse Rate 05/07/18 1659 (!) 104     Resp 05/07/18 1659 14     Temp --      Temp src --      SpO2 05/07/18 1700 100 %     Weight 05/07/18 1659 131 lb (59.4 kg)     Height 05/07/18 1659 5\' 5"  (1.651 m)     Head Circumference --      Peak Flow --      Pain Score 05/07/18 1659 0     Pain Loc --      Pain Edu? --      Excl. in [place]? --    Constitutional: Alert and oriented. Well appearing and in no distress. Eyes: Normal exam ENT   Head: Normocephalic and atraumatic.   Mouth/Throat: Mucous membranes are moist. Cardiovascular: Normal rate, regular rhythm.  Respiratory: Normal respiratory effort without tachypnea nor retractions. Breath sounds are clear Gastrointestinal: Soft and nontender. No distention.   Musculoskeletal: Nontender with normal range of motion in all extremities.  Neurologic:  Normal speech and language. No gross focal neurologic deficits Skin:  Skin is warm, dry and intact.  Psychiatric: Mood and affect are normal.   ____________________________________________    EKG  EKG  viewed and interpreted by myself shows sinus tachycardia 101 bpm with a widened QRS, left axis deviation, largely normal intervals with nonspecific ST changes.  Patient does have lateral T wave inversions however this appears unchanged from 10/11/2016.  ____________________________________________   INITIAL IMPRESSION / ASSESSMENT AND PLAN / ED COURSE  Pertinent labs & imaging results that were available during my care of the patient were reviewed by me and considered in my medical decision making (see chart for details).  Patient presents to the emergency department for dizziness and defibrillation.  Differential would include cardiac arrhythmia such as ventricular fibrillation or ventricular tachycardia.  We will interrogate the patient's pacemaker, check basic labs including a chemistry and continue to closely monitor.  Patient's labs have resulted with an elevated troponin 2.29.  Although we do expect a troponin elevation after defibrillation this appears to be more than what I would expect.  I discussed patient with Dr. Henrene Pastor shows of cardiology who agrees that this is a higher troponin than expected after AICD defibrillation.  As the patient is chest pain-free we will hold off on heparin, patient restarted his Xarelto yesterday although states he had been out of it for 2 or 3 weeks.  We will admit to the hospital service for continued telemetry monitoring and cycling of enzymes.  Patient agreeable to plan of care.  Patient's AICD interrogation consistent with ventricular tachycardia with AICD discharge.  ____________________________________________   FINAL CLINICAL IMPRESSION(S) / ED DIAGNOSES  Elevated troponin Defibrillation   Harvest Dark, MD 05/07/18 2018    Harvest Dark, MD 05/07/18 2019

## 2018-05-08 ENCOUNTER — Inpatient Hospital Stay
Admit: 2018-05-08 | Discharge: 2018-05-08 | Disposition: A | Discharge status: Home / Self Care | Payer: Medicare Other | Attending: Internal Medicine | Admitting: Internal Medicine

## 2018-05-08 DIAGNOSIS — E43 Unspecified severe protein-calorie malnutrition: Secondary | ICD-10-CM

## 2018-05-08 LAB — TROPONIN I
TROPONIN I: 2.22 ng/mL — AB (ref <0.03)
TROPONIN I: 2.45 ng/mL — AB (ref <0.03)
Troponin I: 1.94 ng/mL (ref <0.03)

## 2018-05-08 LAB — ECHOCARDIOGRAM COMPLETE
Height: 65 in
Weight: 2027.2 oz

## 2018-05-08 LAB — MAGNESIUM: Magnesium: 1.9 mg/dL (ref 1.7–2.4)

## 2018-05-08 LAB — GLUCOSE, CAPILLARY
Glucose-Capillary: 145 mg/dL — ABNORMAL HIGH (ref 70–99)
Glucose-Capillary: 171 mg/dL — ABNORMAL HIGH (ref 70–99)

## 2018-05-08 MED ORDER — AMIODARONE HCL 200 MG PO TABS
400.0000 mg | ORAL_TABLET | Freq: Every day | ORAL | Status: DC
  Filled 2018-05-08: qty 2

## 2018-05-08 MED ORDER — AMIODARONE HCL 400 MG PO TABS: 400.0000 mg | ORAL_TABLET | Freq: Every day | ORAL | 0 refills | Status: AC

## 2018-05-08 MED ORDER — GLUCERNA SHAKE PO LIQD
237.0000 mL | Freq: Three times a day (TID) | ORAL | Status: DC
  Administered 2018-05-08: 237 mL via ORAL

## 2018-05-08 MED ORDER — RIVAROXABAN 20 MG PO TABS
20.0000 mg | ORAL_TABLET | Freq: Every day | ORAL | Status: DC
  Administered 2018-05-08: 20 mg via ORAL
  Filled 2018-05-08: qty 1

## 2018-05-08 NOTE — Progress Notes (Signed)
The patient is admitted to 2 A with the diagnosis of Ventricular arrhythmia. A & O x 4. Denied any acute pain at this time. Patient oriented to his room, staff, ascom/call bell. Full assessment to epic completed. Will continue to monitor.

## 2018-05-08 NOTE — Care Management Obs Status (Signed)
[place] NOTIFICATION   Patient Details  Name: Loudon Krakow MRN: [id number] Date of Birth: 09-03-1945   Medicare Observation Status Notification Given:  Yes    Elza Rafter, RN 05/08/2018, 3:16 PM

## 2018-05-08 NOTE — Consult Note (Signed)
[facility]  CARDIOLOGY CONSULT NOTE  Patient ID: Jeff Boone MRN: [id number] DOB/AGE: Apr 29, 1945 73 y.o.  Admit date: 05/07/2018 Referring Physician Posey Pronto Primary Physician [facility] Primary Cardiologist Sabra Heck Reason for Consultation ventricular tachycardia  HPI: 73 year old gentleman referred for evaluation of ventricular tachycardia.  The patient has known coronary artery disease, history of STEMI, status post BMS to LAD 03/2012.  Patient has ischemic cardiomyopathy, status post ICD 09/27/2013.  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  Patient has a history of paroxysmal atrial fibrillation, on Xarelto, which was recently restarted 05/06/2018.  The patient reports he has been feeling well, denies chest pain, shortness of breath, palpitations, heart racing.  He was in his usual state of health yesterday, walking the dog, experienced dizziness, went back into the house, when his ICD fired.  He presented to [facility] emergency room, where ECG revealed sinus tachycardia at a rate of 101 bpm, old anterior MI, without acute ischemic ST-T wave changes.  ICD interrogation revealed appropriate for ventricular tachycardia.  Admission labs were notable for elevated troponin of 2.22, followed by 2.45, 2.21, 2.29.  Patient continues to deny chest pain.  Review of systems complete and found to be negative unless listed above     Past Medical History:  Diagnosis Date  . AICD (automatic cardioverter/defibrillator) present    MEDTRONIC  . 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 Arthritis   . Atrial fibrillation ([place])   . Cardiogenic shock ([place])   . Coronary artery disease   . Diabetes mellitus without complication ([place])    Pt takes Metformin  . Dysrhythmia   . GERD (gastroesophageal reflux disease)   . History of kidney stones   .  Hypertension   . Prostate cancer ([place]) 2010  . STEMI (ST elevation myocardial infarction) ([place]) 03/2012  . Urolithiasis   . Vitamin D deficiency     Past Surgical History:  Procedure Laterality Date  . COLONOSCOPY WITH PROPOFOL N/A 12/12/2014   Procedure: COLONOSCOPY WITH PROPOFOL;  Surgeon: Hulen Luster, MD;  Location: [facility] ENDOSCOPY;  Service: Gastroenterology;  Laterality: N/A;  . CORONARY ANGIOPLASTY WITH STENT PLACEMENT  2014  . HYDROCELE EXCISION Right 10/15/2016   Procedure: HYDROCELECTOMY ADULT;  Surgeon: Royston Cowper, MD;  Location: [facility] ORS;  Service: Urology;  Laterality: Right;  . ICD IMPLANT    . INSERTION PROSTATE RADIATION SEED  2010    Medications Prior to Admission  Medication Sig Dispense Refill Last Dose  . aspirin EC 81 MG tablet Take 81 mg by mouth daily.   05/07/2018 at 0830  . ezetimibe (ZETIA) 10 MG tablet Take 10 mg by mouth daily.   05/07/2018 at 0830  . lisinopril (PRINIVIL,ZESTRIL) 2.5 MG tablet Take 2.5 mg by mouth daily.    05/07/2018 at 0830  . metFORMIN (GLUCOPHAGE) 1000 MG tablet Take 1,000 mg by mouth 2 (two) times daily.   05/07/2018 at 0800  . metoprolol succinate (TOPROL-XL) 25 MG 24 hr tablet Take 25 mg by mouth every morning.    05/07/2018 at 0830  . Multiple Vitamin (MULTIVITAMIN WITH MINERALS) TABS tablet Take 1 tablet by mouth daily.   05/07/2018 at 0830  . omeprazole (PRILOSEC) 20 MG capsule Take 20 mg by mouth daily.    05/07/2018 at 0800  . predniSONE (DELTASONE) 5 MG tablet Take 2.5 mg by mouth daily.  05/07/2018 at 0830  . rivaroxaban (XARELTO) 20 MG TABS tablet Take 20 mg by mouth daily with breakfast.   05/07/2018 at 0830  . tamsulosin (FLOMAX) 0.4 MG CAPS capsule Take 0.4 mg by mouth at bedtime.    05/06/2018 at 2000  . triamcinolone cream (KENALOG) 0.1 % Apply 1 application topically 2 (two) times daily as needed (rash / irritation).   Unknown at PRN   Social History   Socioeconomic History  . Marital status: Married    Spouse name: Not on  file  . Number of children: Not on file  . Years of education: Not on file  . Highest education level: Not on file  Occupational History  . Not on file  Social Needs  . Financial resource strain: Not on file  . Food insecurity:    Worry: Not on file    Inability: Not on file  . Transportation needs:    Medical: Not on file    Non-medical: Not on file  Tobacco Use  . Smoking status: Former Smoker    Years: 2.00    Types: Cigarettes  . Smokeless tobacco: Never Used  . Tobacco comment: only smoked socially  Substance and Sexual Activity  . Alcohol use: No  . Drug use: No  . Sexual activity: Not on file  Lifestyle  . Physical activity:    Days per week: Not on file    Minutes per session: Not on file  . Stress: Not on file  Relationships  . Social connections:    Talks on phone: Not on file    Gets together: Not on file    Attends religious service: Not on file    Active member of club or organization: Not on file    Attends meetings of clubs or organizations: Not on file    Relationship status: Not on file  . Intimate partner violence:    Fear of current or ex partner: Not on file    Emotionally abused: Not on file    Physically abused: Not on file    Forced sexual activity: Not on file  Other Topics Concern  . Not on file  Social History Narrative  . Not on file    History reviewed. No pertinent family history.    Review of systems complete and found to be negative unless listed above      PHYSICAL EXAM  General: Well developed, well nourished, in no acute distress HEENT:  Normocephalic and atramatic Neck:  No JVD.  Lungs: Clear bilaterally to auscultation and percussion. Heart: HRRR . Normal S1 and S2 without gallops or murmurs.  Abdomen: Bowel sounds are positive, abdomen soft and non-tender  Msk:  Back normal, normal gait. Normal strength and tone for age. Extremities: No clubbing, cyanosis or edema.   Neuro: Alert and oriented X 3. Psych:  Good  affect, responds appropriately  Labs:   Lab Results  Component Value Date   WBC 5.0 05/07/2018   HGB 11.7 (L) 05/07/2018   HCT 36.1 (L) 05/07/2018   MCV 83.2 05/07/2018   PLT 257 05/07/2018    Recent Labs  Lab 05/07/18 1709  NA 135  K 4.2  CL 102  CO2 27  BUN 14  CREATININE 0.70  CALCIUM 8.4*  PROT 6.5  BILITOT 0.7  ALKPHOS 58  ALT 66*  AST 43*  GLUCOSE 225*   Lab Results  Component Value Date   CKTOTAL 1,897 (H) 07/06/2012   TROPONINI 2.22 (HH) 05/08/2018   No  results found for: CHOL No results found for: HDL No results found for: LDLCALC No results found for: TRIG No results found for: CHOLHDL No results found for: LDLDIRECT    Radiology: Dg Chest Portable 1 View  Result Date: 05/07/2018 CLINICAL DATA:  Pt in via EMS from home d/t dizziness AND defib shock from ICD. First time pt has ever been shocked from it. EXAM: PORTABLE CHEST 1 VIEW COMPARISON:  CT, 07/02/2012 FINDINGS: Cardiac silhouette is normal in size. No mediastinal or hilar masses. There is no evidence of adenopathy. Left anterior chest wall AICD has its lead projecting in the right ventricle. Clear lungs.  No pleural effusion or pneumothorax. Skeletal structures are grossly intact. IMPRESSION: No active disease. Electronically Signed   By: Lajean Manes M.D.   On: 05/07/2018 20:44    EKG: Sinus tachycardia with old anterior MI  ASSESSMENT AND PLAN:   1.  Ventricular tachycardia, in the setting of dilated cardiomyopathy, with appropriate ICD shock 2.  Elevated troponin, without rise and fall, likely consistent with ICD shock 3.  Known CAD, currently without chest pain, without ECG changes  Recommendations  1.  Agree with current therapy 2.  We discussed the possibility of cardiac catheterization, patient is agreeable, however, patient just received dose of Xarelto. 3.  Consider adding amiodarone, will discuss with Dr. Mylinda Latina ( [facility] ) 4.  Ambulate today, if patient does well consider  discharge later today with early follow-up next week  Signed: Isaias Cowman MD,PhD, [facility] 05/08/2018, 8:15 AM

## 2018-05-08 NOTE — Progress Notes (Signed)
*  PRELIMINARY RESULTS* Echocardiogram 2D Echocardiogram has been performed.  Jeff Boone 05/08/2018, 12:54 PM

## 2018-05-08 NOTE — Progress Notes (Signed)
Jeff Boone to be D/C'd Home per MD order.  Discussed prescriptions and follow up appointments with the patient. Prescriptions were e-prescribed, medication list explained in detail. Appointment with Dr. Saralyn Pilar schedule for this Monday, March 2 at 10 a.m.Pt verbalized understanding.  Allergies as of 05/08/2018      Reactions   Statins Other (See Comments)   Muscle pain    Azithromycin Rash   Xylocaine [lidocaine] Other (See Comments)   Dizziness       Medication List    TAKE these medications   amiodarone 400 MG tablet Commonly known as:  PACERONE Take 1 tablet (400 mg total) by mouth daily for 30 days.   aspirin EC 81 MG tablet Take 81 mg by mouth daily.   ezetimibe 10 MG tablet Commonly known as:  ZETIA Take 10 mg by mouth daily.   lisinopril 2.5 MG tablet Commonly known as:  PRINIVIL,ZESTRIL Take 2.5 mg by mouth daily.   metFORMIN 1000 MG tablet Commonly known as:  GLUCOPHAGE Take 1,000 mg by mouth 2 (two) times daily.   metoprolol succinate 25 MG 24 hr tablet Commonly known as:  TOPROL-XL Take 25 mg by mouth every morning.   multivitamin with minerals Tabs tablet Take 1 tablet by mouth daily.   omeprazole 20 MG capsule Commonly known as:  PRILOSEC Take 20 mg by mouth daily.   predniSONE 5 MG tablet Commonly known as:  DELTASONE Take 2.5 mg by mouth daily.   rivaroxaban 20 MG Tabs tablet Commonly known as:  XARELTO Take 20 mg by mouth daily with breakfast.   tamsulosin 0.4 MG Caps capsule Commonly known as:  FLOMAX Take 0.4 mg by mouth at bedtime.   triamcinolone cream 0.1 % Commonly known as:  KENALOG Apply 1 application topically 2 (two) times daily as needed (rash / irritation).       Vitals:   05/08/18 0735 05/08/18 1508  BP: 121/75 131/73  Pulse: 81 81  Resp: 16 16  Temp: 97.8 F (36.6 C) 98 F (36.7 C)  SpO2: 97% 98%    Tele box removed and returned. Skin clean, dry and intact without evidence of skin break down, no evidence  of skin tears noted. IV catheter discontinued intact. Site without signs and symptoms of complications. Dressing and pressure applied. Pt denies pain at this time. No complaints noted.  An After Visit Summary was printed and given to the patient. Patient escorted via [place], and D/C home via private auto.  Jeff Boone

## 2018-05-08 NOTE — Care Management CC44 (Signed)
Condition Code 44 Documentation Completed  Patient Details  Name: Dain Laseter MRN: [id number] Date of Birth: March 28, 1945   Condition Code 44 given:  Yes Patient signature on Condition Code 44 notice:  Yes Documentation of 2 MD's agreement:  Yes Code 44 added to claim:  Yes    Elza Rafter, RN 05/08/2018, 3:16 PM

## 2018-05-08 NOTE — Progress Notes (Signed)
Initial Nutrition Assessment  DOCUMENTATION CODES:   Severe malnutrition in context of chronic illness  INTERVENTION:   - Glucerna Shake po TID, each supplement provides 220 kcal and 10 grams of protein  - Continue MVI with minerals daily  - Provided education and handout regarding high-calorie, high-protein nutrition therapy  - Double protein portions TID with meals  NUTRITION DIAGNOSIS:   Severe Malnutrition related to chronic illness (cardiogenic shock s/p AICD) as evidenced by moderate to severe fat depletion, mild muscle depletion to severe muscle depletion.  GOAL:   Patient will meet greater than or equal to 90% of their needs  MONITOR:   PO intake, Supplement acceptance, Labs, Weight trends  REASON FOR ASSESSMENT:   Malnutrition Screening Tool    ASSESSMENT:   73 year old male with PMH of arthritis, atrial fibrillation, cardiogenic shock s/p AICD, gastric reflux, HTN, CAD, DM, GERD, previous MI. Pt presented to the ED on 2/27 with an episode of dizziness with AICD defibrillation.  Spoke with pt and wife at bedside. Pt reports that he typically has a "great" appetite and "eats healthy" but that his appetite decreased 2 weeks ago after starting a new medication. Pt cannot recall the name of the medication. Pt states that during these 2 weeks, he has lost 5 lbs. RD obtained bed weight at time of visit and recorded in chart: 126.5 lbs.  Pt reports that when his appetite is good, he eats 3 meals and 2 snacks daily. Pt reports watching his serving size of carbohydrate-containing foods due to DM diagnosis. Pt rarely drinks soda but consumes diet soda when he does.  Breakfast: oatmeal, 1/2 banana, coffee with Splenda, water, [place] and cheese sandwich on whole grain bread AM snack: crackers with peanut butter or celery with peanut butter Lunch: homemade vegetable and chicken soup, beans, rice PM snack: peanut butter toast Dinner: rice, beans, vegetables OR chicken, sweet  potato, green beans, peas  Pt shares that he has experienced weight loss "for a long time." Pt reports that prior to weight loss, he weighs 145-150 lbs. Pt reports that he has been losing more weigh lately. Weight history in chart is very limited. Last weight PTA is from 2018 so unable to determine extensiveness of recent weight loss. However, pt's weight has been trending down since 2016.  Pt reports that he manages his DM well at home and checks his sugars twice daily (around 125 when checked). Pt states that since he started Prednisone, his sugars have been higher.  Pt states that he exercises "a lot" and does not like to be still. Pt reports walking, riding the bike, and doing outside chores.  Pt amenable to drinking oral nutrition supplement during admission. RD provided pt with strawberry Ensure Enlive which he enjoyed. RD will also order double protein portions with meals. MVI with minerals currently ordered.  RD provided several handouts from the Academy of Nutrition and Dietetics including "High-Calorie, High-Protein Nutrition Therapy," "Tips for Increasing Calories," and "Tips for Adding Protein." Discussed ways to increase calories and protein without adding volume. Encouraged 3 meals and 3 snacks daily. Provided information on recommended oral nutrition supplements after discharge. Pt and family very appreciative of education and handouts. RD encouraged pt and family to follow up with outpatient RD if more information and closer monitoring is desired.  Meal completion charted as 0%. However, noted ~90% completed breakfast meal tray at bedside.  Medications reviewed and include: SSI, Metformin, MVI with minerals, Protonix, Prednisone  Labs reviewed: slightly elevated LFTs CBG's: 145,  130  NUTRITION - FOCUSED PHYSICAL EXAM:    Most Recent Value  Orbital Region  Moderate depletion  Upper Arm Region  Severe depletion  Thoracic and Lumbar Region  Severe depletion  Buccal Region   Moderate depletion  Temple Region  Moderate depletion  Clavicle Bone Region  Moderate depletion  Clavicle and Acromion Bone Region  Severe depletion  Scapular Bone Region  Severe depletion  Dorsal Hand  Moderate depletion  Patellar Region  Mild depletion  Anterior Thigh Region  Moderate depletion  Posterior Calf Region  Mild depletion  Edema (RD Assessment)  None  Hair  Reviewed  Eyes  Reviewed  Mouth  Reviewed  Skin  Reviewed  Nails  Reviewed       Diet Order:   Diet Order            Diet Heart Room service appropriate? Yes; Fluid consistency: Thin  Diet effective now              EDUCATION NEEDS:   Education needs have been addressed  Skin:  Skin Assessment: Reviewed RN Assessment  Last BM:  2/27  Height:   Ht Readings from Last 1 Encounters:  05/08/18 5\' 5"  (1.651 m)    Weight:   Wt Readings from Last 1 Encounters:  05/08/18 57.5 kg    Ideal Body Weight:  61.8 kg  BMI:  Body mass index is 21.08 kg/m.  Estimated Nutritional Needs:   Kcal:  2000-2200  Protein:  90-105 grams  Fluid:  >/= 2.0 L    Gaynell Face, MS, RD, LDN Inpatient Clinical Dietitian Pager: [phone] Weekend/After Hours: [phone]

## 2018-05-08 NOTE — Progress Notes (Signed)
Ch educated pt and wife present about H-POA and living will. Ch encouraged pt to complete form when he is with other family members to inform them that he does have one and of his wishes for the purpose of future hospitalizations. Pt agreed and felt better completing the document at a later time. Pt and wife shared that they had family members that did not hv an AD and the family became divided as to how to proceed with end of life care for a loved one. Pt shared that both he and his wife would like to be better prepared. Ch agreed. Ch and wife gather at bedside w/ pt for prayer. Ch had pt to led prayer in Spanish (native tongue). Pt and wife were appreciative of visit and ch stated the same. No further needs at this time- pt is being d/c.       05/08/18 1200  Clinical Encounter Type  Visited With Patient and family together  Visit Type Psychological support;Spiritual support;Social support  Referral From Physician  Consult/Referral To Chaplain  Spiritual Encounters  Spiritual Needs Prayer;Emotional;Grief support  Stress Factors  Patient Stress Factors Health changes;Loss of control;Major life changes  Family Stress Factors Major life changes  Advance Directives (For Healthcare)  Does Patient Have a Medical Advance Directive? No  Would patient like information on creating a medical advance directive? Yes (Inpatient - patient defers creating a medical advance directive at this time)

## 2018-05-08 NOTE — Progress Notes (Signed)
Pt ambulated in the hallway and made three laps around the nurses' station with no chest pain, SOB, or signs of exertion.

## 2018-05-08 NOTE — Discharge Summary (Signed)
[facility] [place] NAME: Jeff Boone    MR#:  [id number]  DATE OF BIRTH:  11-23-1945  DATE OF ADMISSION:  05/07/2018 ADMITTING PHYSICIAN: Fritzi Mandes, MD  DATE OF DISCHARGE: 05/08/2018  PRIMARY CARE PHYSICIAN: Juluis Pitch, MD   ADMISSION DIAGNOSIS:  Ventricular tachycardia ([place]) [I47.2] Elevated troponin [R79.89] AICD discharge [Z45.02]  DISCHARGE DIAGNOSIS:  Active Problems:   Ventricular arrhythmia   Protein-calorie malnutrition, severe AICD discharge Elevated troponin secondary to ICD shock  SECONDARY DIAGNOSIS:   Past Medical History:  Diagnosis Date  . AICD (automatic cardioverter/defibrillator) present    [employer]ONIC  . 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 Arthritis   . Atrial fibrillation ([place])   . Cardiogenic shock ([place])   . Coronary artery disease   . Diabetes mellitus without complication ([place])    Pt takes Metformin  . Dysrhythmia   . GERD (gastroesophageal reflux disease)   . History of kidney stones   . Hypertension   . Prostate cancer ([place]) 2010  . STEMI (ST elevation myocardial infarction) ([place]) 03/2012  . Urolithiasis   . Vitamin D deficiency      ADMITTING HISTORY Jeff Boone  is a 73 y.o. male with a known history of ischemic cardiomyopathy EF of 35% by last echo, history of atrial fibrillation on Xarelto, AICD, CAD, hypertension and diabetes comes to the emergency room after he started feeling dizziness while walking his dog in the backyard. Patient a pop sound in his chest and he describes as his heart was going to come out. He became very pale according to the daughter called EMS brought to the emergency room. He was found to have troponin of 2.19. His AICD was interrogated and was noted to have an episode of ventricular tachycardia status post defibrillator shock.Patient denies any chest pain.  ER physician spoke with cardiology Dr. Lourena Simmonds.Patient is being admitted for further evaluation management.  HOSPITAL COURSE:  Patient was admitted to telemetry.  Troponins were trended.  Troponins trended down.  No complaints of any chest pain.  Cardiology evaluated the patient.  They recommended patient to be discharged on oral amiodarone.  Follow-up with EP physician.  ICD shock was appropriate for ventricular tachycardia.  Patient was worked up with echocardiogram during hospitalization.  Discussed with cardiology patient will be discharged home.  CONSULTS OBTAINED:  Treatment Team:  Isaias Cowman, MD  DRUG ALLERGIES:   Allergies  Allergen Reactions  . Statins Other (See Comments)    Muscle pain   . Azithromycin Rash  . Xylocaine [Lidocaine] Other (See Comments)    Dizziness     DISCHARGE MEDICATIONS:   Allergies as of 05/08/2018      Reactions   Statins Other (See Comments)   Muscle pain    Azithromycin Rash   Xylocaine [lidocaine] Other (See Comments)   Dizziness       Medication List    TAKE these medications   amiodarone 400 MG tablet Commonly known as:  PACERONE Take 1 tablet (400 mg total) by mouth daily for 30 days.   aspirin EC 81 MG tablet Take 81 mg by mouth daily.   ezetimibe 10 MG tablet Commonly known as:  ZETIA Take 10 mg by mouth daily.   lisinopril 2.5 MG tablet Commonly known as:  PRINIVIL,ZESTRIL Take 2.5 mg by mouth daily.   metFORMIN 1000 MG tablet Commonly known as:  GLUCOPHAGE Take 1,000 mg by mouth 2 (two) times daily.   metoprolol succinate 25 MG 24 hr tablet Commonly known as:  TOPROL-XL Take 25 mg by mouth every morning.   multivitamin with minerals Tabs tablet Take 1 tablet by mouth daily.   omeprazole 20 MG capsule Commonly known as:  PRILOSEC Take 20 mg by mouth daily.   predniSONE 5 MG tablet Commonly known as:  DELTASONE Take 2.5 mg by mouth daily.   rivaroxaban 20 MG Tabs tablet Commonly known as:   XARELTO Take 20 mg by mouth daily with breakfast.   tamsulosin 0.4 MG Caps capsule Commonly known as:  FLOMAX Take 0.4 mg by mouth at bedtime.   triamcinolone cream 0.1 % Commonly known as:  KENALOG Apply 1 application topically 2 (two) times daily as needed (rash / irritation).       Today  Patient seen today No chest pain No shortness of breath No palpitations Hemodynamically stable VITAL SIGNS:  Blood pressure 131/73, pulse 81, temperature 98 F (36.7 C), temperature source Oral, resp. rate 16, height 5\' 5"  (1.651 m), weight 57.5 kg, SpO2 98 %.  I/O:    Intake/Output Summary (Last 24 hours) at 05/08/2018 1519 Last data filed at 05/08/2018 1100 Gross per 24 hour  Intake 1000 ml  Output 1100 ml  Net -100 ml    PHYSICAL EXAMINATION:  Physical Exam  GENERAL:  73 y.o.-year-old patient lying in the bed with no acute distress.  LUNGS: Normal breath sounds bilaterally, no wheezing, rales,rhonchi or crepitation. No use of accessory muscles of respiration.  CARDIOVASCULAR: S1, S2 normal. No murmurs, rubs, or gallops.  ABDOMEN: Soft, non-tender, non-distended. Bowel sounds present. No organomegaly or mass.  NEUROLOGIC: Moves all 4 extremities. PSYCHIATRIC: The patient is alert and oriented x 3.  SKIN: No obvious rash, lesion, or ulcer.   DATA REVIEW:   CBC Recent Labs  Lab 05/07/18 1709  WBC 5.0  HGB 11.7*  HCT 36.1*  PLT 257    Chemistries  Recent Labs  Lab 05/07/18 1709 05/08/18 0030  NA 135  --   K 4.2  --   CL 102  --   CO2 27  --   GLUCOSE 225*  --   BUN 14  --   CREATININE 0.70  --   CALCIUM 8.4*  --   MG  --  1.9  AST 43*  --   ALT 66*  --   ALKPHOS 58  --   BILITOT 0.7  --     Cardiac Enzymes Recent Labs  Lab 05/08/18 1110  TROPONINI 1.94*    Microbiology Results  No results found for this or any previous visit.  RADIOLOGY:  Dg Chest Portable 1 View  Result Date: 05/07/2018 CLINICAL DATA:  Pt in via EMS from home d/t dizziness  AND defib shock from ICD. First time pt has ever been shocked from it. EXAM: PORTABLE CHEST 1 VIEW COMPARISON:  CT, 07/02/2012 FINDINGS: Cardiac silhouette is normal in size. No mediastinal or hilar masses. There is no evidence of adenopathy. Left anterior chest wall AICD has its lead projecting in the right ventricle. Clear lungs.  No pleural effusion or pneumothorax. Skeletal structures are grossly intact. IMPRESSION: No active disease. Electronically Signed   By: Lajean Manes M.D.   On: 05/07/2018 20:44    Follow up with PCP in 1 week.  Management plans discussed with the patient, family and they are in agreement.  CODE STATUS: Full code    Code  Status Orders  (From admission, onward)         Start     Ordered   05/07/18 2321  Full code  Continuous     05/07/18 2321        Code Status History    This patient has a current code status but no historical code status.      TOTAL TIME TAKING CARE OF THIS PATIENT ON DAY OF DISCHARGE: more than 35 minutes.   Saundra Shelling M.D on 05/08/2018 at 3:19 PM  Between 7am to 6pm - Pager - [phone]  After 6pm go to [email or web address] - password [email or web address] [facility]  Office  [phone]  CC: Primary care physician; Juluis Pitch, MD  Note: This dictation was prepared with Dragon dictation along with smaller phrase technology. Any transcriptional errors that result from this process are unintentional.

## 2018-05-20 ENCOUNTER — Ambulatory Visit
Admission: RE | Admit: 2018-05-20 | Discharge: 2018-05-20 | Disposition: A | Discharge status: Home / Self Care | Payer: Medicare Other | Attending: Cardiology | Admitting: Cardiology

## 2018-05-20 ENCOUNTER — Encounter
Admission: RE | Disposition: A | Discharge status: Home / Self Care | Payer: Self-pay | Source: Home / Self Care | Attending: Cardiology

## 2018-05-20 ENCOUNTER — Other Ambulatory Visit: Payer: Self-pay

## 2018-05-20 DIAGNOSIS — I472 Ventricular tachycardia: Secondary | ICD-10-CM | POA: Insufficient documentation

## 2018-05-20 DIAGNOSIS — Z7984 Long term (current) use of oral hypoglycemic drugs: Secondary | ICD-10-CM | POA: Insufficient documentation

## 2018-05-20 DIAGNOSIS — Z7982 Long term (current) use of aspirin: Secondary | ICD-10-CM | POA: Diagnosis not present

## 2018-05-20 DIAGNOSIS — I255 Ischemic cardiomyopathy: Secondary | ICD-10-CM | POA: Diagnosis present

## 2018-05-20 DIAGNOSIS — Z79899 Other long term (current) drug therapy: Secondary | ICD-10-CM | POA: Diagnosis not present

## 2018-05-20 DIAGNOSIS — E119 Type 2 diabetes mellitus without complications: Secondary | ICD-10-CM | POA: Insufficient documentation

## 2018-05-20 DIAGNOSIS — Z7901 Long term (current) use of anticoagulants: Secondary | ICD-10-CM | POA: Diagnosis not present

## 2018-05-20 DIAGNOSIS — I480 Paroxysmal atrial fibrillation: Secondary | ICD-10-CM | POA: Diagnosis not present

## 2018-05-20 DIAGNOSIS — Z955 Presence of coronary angioplasty implant and graft: Secondary | ICD-10-CM | POA: Diagnosis not present

## 2018-05-20 DIAGNOSIS — Z95810 Presence of automatic (implantable) cardiac defibrillator: Secondary | ICD-10-CM | POA: Insufficient documentation

## 2018-05-20 DIAGNOSIS — Z87891 Personal history of nicotine dependence: Secondary | ICD-10-CM | POA: Diagnosis not present

## 2018-05-20 DIAGNOSIS — I2510 Atherosclerotic heart disease of native coronary artery without angina pectoris: Secondary | ICD-10-CM | POA: Diagnosis not present

## 2018-05-20 DIAGNOSIS — I10 Essential (primary) hypertension: Secondary | ICD-10-CM | POA: Insufficient documentation

## 2018-05-20 DIAGNOSIS — I420 Dilated cardiomyopathy: Secondary | ICD-10-CM | POA: Insufficient documentation

## 2018-05-20 HISTORY — PX: LEFT HEART CATH AND CORONARY ANGIOGRAPHY: CATH118249

## 2018-05-20 LAB — GLUCOSE, CAPILLARY
Glucose-Capillary: 136 mg/dL — ABNORMAL HIGH (ref 70–99)
Glucose-Capillary: 150 mg/dL — ABNORMAL HIGH (ref 70–99)

## 2018-05-20 SURGERY — LEFT HEART CATH AND CORONARY ANGIOGRAPHY
Anesthesia: Moderate Sedation | Laterality: Left

## 2018-05-20 MED ORDER — ONDANSETRON HCL 4 MG/2ML IJ SOLN: 4.0000 mg | Freq: Four times a day (QID) | INTRAMUSCULAR | Status: DC | PRN

## 2018-05-20 MED ORDER — SODIUM CHLORIDE 0.9 % IV SOLN: 250.0000 mL | INTRAVENOUS | Status: DC | PRN

## 2018-05-20 MED ORDER — VERAPAMIL HCL 2.5 MG/ML IV SOLN
INTRAVENOUS | Status: AC
  Filled 2018-05-20: qty 2

## 2018-05-20 MED ORDER — HEPARIN SODIUM (PORCINE) 1000 UNIT/ML IJ SOLN
INTRAMUSCULAR | Status: AC
  Filled 2018-05-20: qty 1

## 2018-05-20 MED ORDER — ASPIRIN 81 MG PO CHEW
CHEWABLE_TABLET | ORAL | Status: AC
  Filled 2018-05-20: qty 1

## 2018-05-20 MED ORDER — BUPIVACAINE HCL (PF) 0.5 % IJ SOLN
INTRAMUSCULAR | Status: AC
  Filled 2018-05-20: qty 30

## 2018-05-20 MED ORDER — FENTANYL CITRATE (PF) 100 MCG/2ML IJ SOLN
INTRAMUSCULAR | Status: DC | PRN
  Administered 2018-05-20: 25 ug via INTRAVENOUS

## 2018-05-20 MED ORDER — HEPARIN SODIUM (PORCINE) 1000 UNIT/ML IJ SOLN
INTRAMUSCULAR | Status: DC | PRN
  Administered 2018-05-20: 3000 [IU] via INTRAVENOUS

## 2018-05-20 MED ORDER — FENTANYL CITRATE (PF) 100 MCG/2ML IJ SOLN
INTRAMUSCULAR | Status: AC
  Filled 2018-05-20: qty 2

## 2018-05-20 MED ORDER — BUPIVACAINE HCL (PF) 0.5 % IJ SOLN
INTRAMUSCULAR | Status: DC | PRN
  Administered 2018-05-20: 5 mL

## 2018-05-20 MED ORDER — ACETAMINOPHEN 325 MG PO TABS: 650.0000 mg | ORAL_TABLET | ORAL | Status: DC | PRN

## 2018-05-20 MED ORDER — HEPARIN (PORCINE) IN NACL 1000-0.9 UT/500ML-% IV SOLN
INTRAVENOUS | Status: AC
  Filled 2018-05-20: qty 1000

## 2018-05-20 MED ORDER — VERAPAMIL HCL 2.5 MG/ML IV SOLN
INTRAVENOUS | Status: DC | PRN
  Administered 2018-05-20: 2.5 mg via INTRAVENOUS

## 2018-05-20 MED ORDER — SODIUM CHLORIDE 0.9% FLUSH: 3.0000 mL | Freq: Two times a day (BID) | INTRAVENOUS | Status: DC

## 2018-05-20 MED ORDER — ASPIRIN 81 MG PO CHEW
81.0000 mg | CHEWABLE_TABLET | ORAL | Status: AC
  Administered 2018-05-20: 81 mg via ORAL

## 2018-05-20 MED ORDER — MIDAZOLAM HCL 2 MG/2ML IJ SOLN
INTRAMUSCULAR | Status: AC
  Filled 2018-05-20: qty 2

## 2018-05-20 MED ORDER — HEPARIN (PORCINE) IN NACL 1000-0.9 UT/500ML-% IV SOLN
INTRAVENOUS | Status: DC | PRN
  Administered 2018-05-20 (×2): 500 mL

## 2018-05-20 MED ORDER — MIDAZOLAM HCL 2 MG/2ML IJ SOLN
INTRAMUSCULAR | Status: DC | PRN
  Administered 2018-05-20: 1 mg via INTRAVENOUS

## 2018-05-20 MED ORDER — SODIUM CHLORIDE 0.9% FLUSH: 3.0000 mL | INTRAVENOUS | Status: DC | PRN

## 2018-05-20 MED ORDER — SODIUM CHLORIDE 0.9 % WEIGHT BASED INFUSION: 1.0000 mL/kg/h | INTRAVENOUS | Status: DC

## 2018-05-20 MED ORDER — SODIUM CHLORIDE 0.9 % WEIGHT BASED INFUSION
3.0000 mL/kg/h | INTRAVENOUS | Status: AC
  Administered 2018-05-20: 3 mL/kg/h via INTRAVENOUS

## 2018-05-20 MED ORDER — IOPAMIDOL (ISOVUE-300) INJECTION 61%
INTRAVENOUS | Status: DC | PRN
  Administered 2018-05-20: 160 mL via INTRA_ARTERIAL

## 2018-05-20 SURGICAL SUPPLY — 9 items
CATH 5F 110X4 TIG (CATHETERS) ×2 IMPLANT
CATH INFINITI 5 FR JL3.5 (CATHETERS) ×2 IMPLANT
CATH INFINITI 5FR ANG PIGTAIL (CATHETERS) ×2 IMPLANT
CATH INFINITI JR4 5F (CATHETERS) ×2 IMPLANT
DEVICE RAD TR BAND REGULAR (VASCULAR PRODUCTS) ×2 IMPLANT
GLIDESHEATH SLEND SS 6F .021 (SHEATH) ×2 IMPLANT
KIT MANI 3VAL PERCEP (MISCELLANEOUS) ×3 IMPLANT
PACK CARDIAC CATH (CUSTOM PROCEDURE TRAY) ×3 IMPLANT
WIRE ROSEN-J .035X260CM (WIRE) ×2 IMPLANT

## 2018-05-21 ENCOUNTER — Encounter: Payer: Self-pay | Admitting: Cardiology

## 2019-03-19 IMAGING — CT CT ABD-PELV W/ CM
1 of 4 series · 12 of 32 positions shown, 17 images · IV contrast (APPLIED)
Comparison: 07/02/2012

CLINICAL DATA: [DATE] pounds weight loss in the last year, history
of prostate cancer with radiation seeds implants in 7676

EXAM:
CT ABDOMEN AND PELVIS WITH CONTRAST
TECHNIQUE: Multidetector CT imaging of the abdomen and pelvis was performed
using the standard protocol following bolus administration of
intravenous contrast.
CONTRAST:  100mL KQF8XU-ITT IOPAMIDOL (KQF8XU-ITT) INJECTION 61%

[Series 2: axial st · axial · 0.68mm/px · z∈[-1037,-632]mm · 12 of 93 slices shown, 17 images]
[im 6/93  soft-tissue]
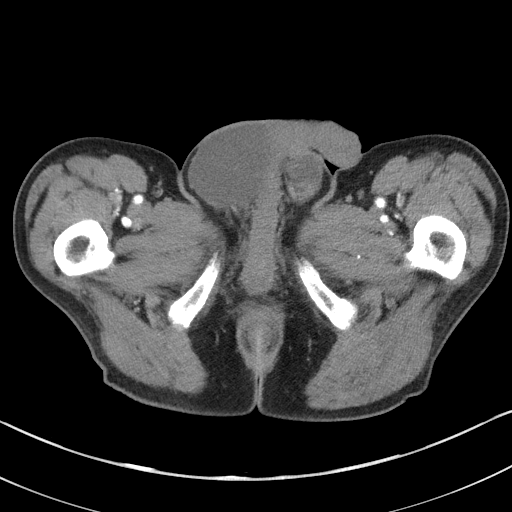
[im 6/93  bone]
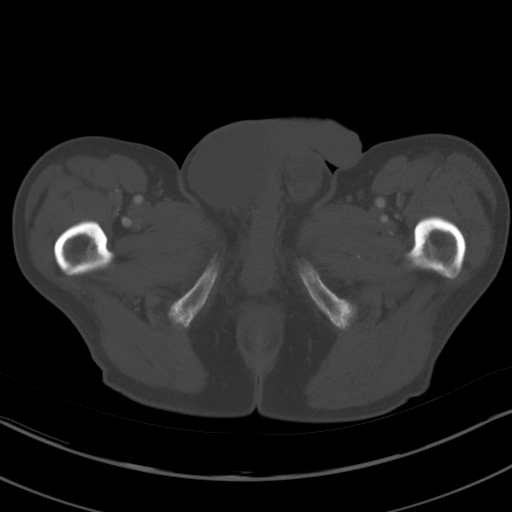
[im 17/93  soft-tissue]
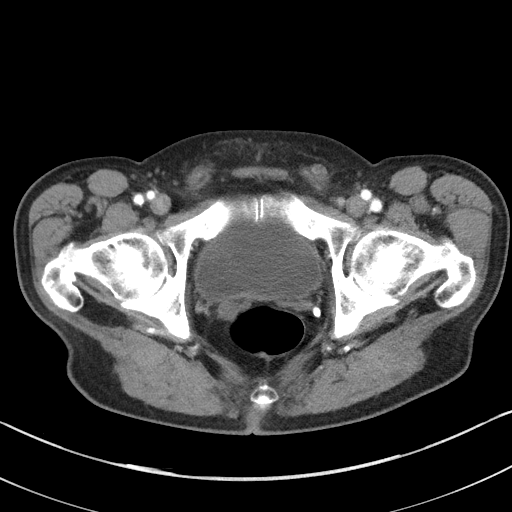
[im 22/93  soft-tissue]
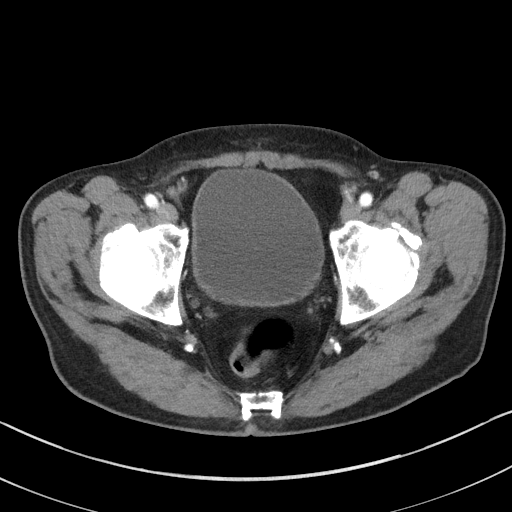
[im 33/93  soft-tissue]
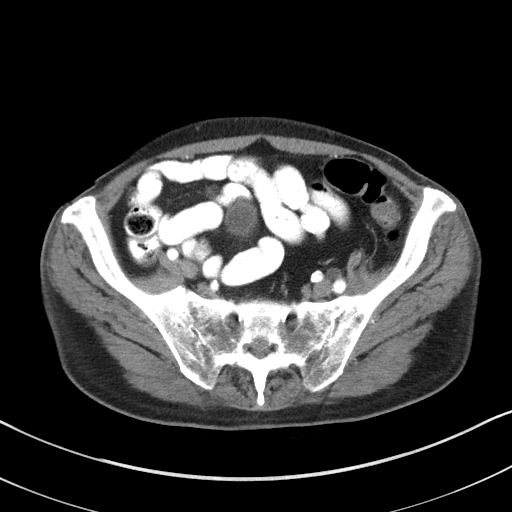
[im 38/93  soft-tissue]
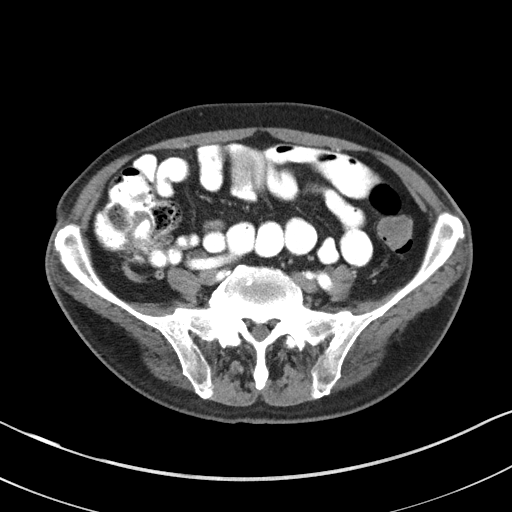
[im 49/93  soft-tissue]
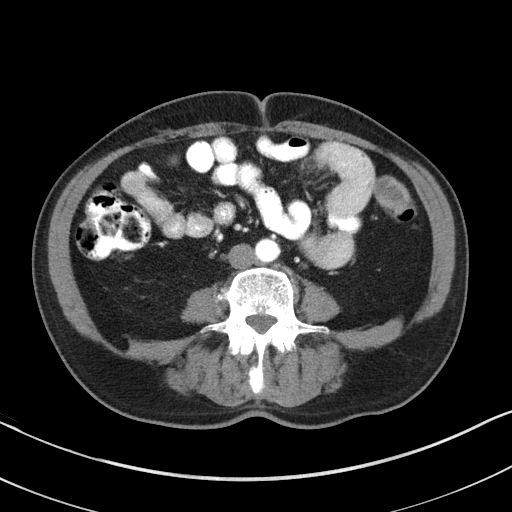
[im 55/93  soft-tissue]
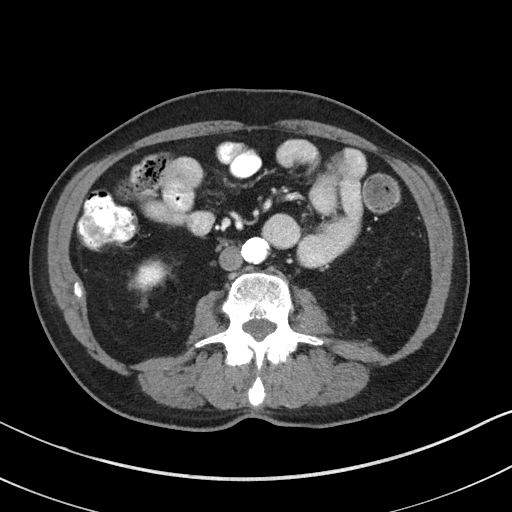
[im 60/93  soft-tissue]
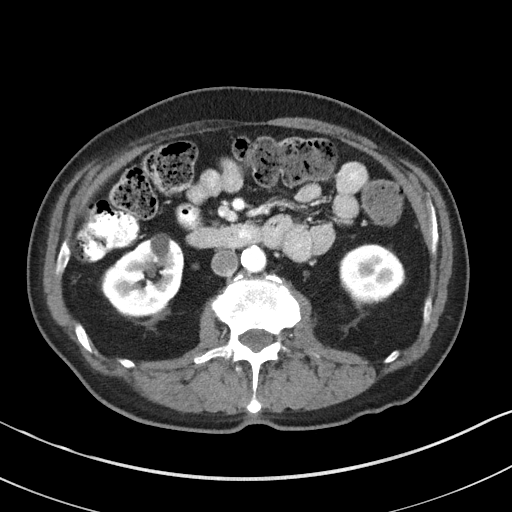
[im 71/93  soft-tissue]
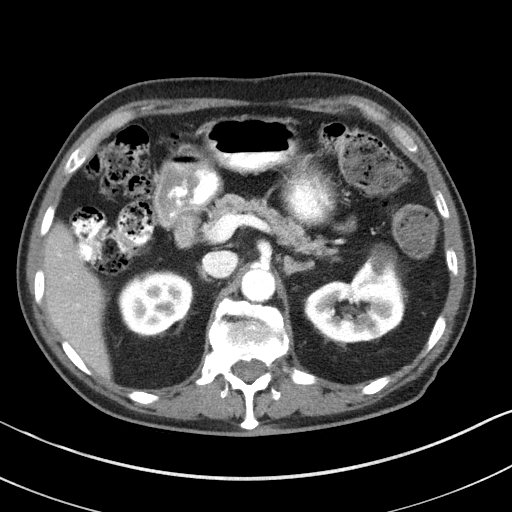
[im 71/93  lung]
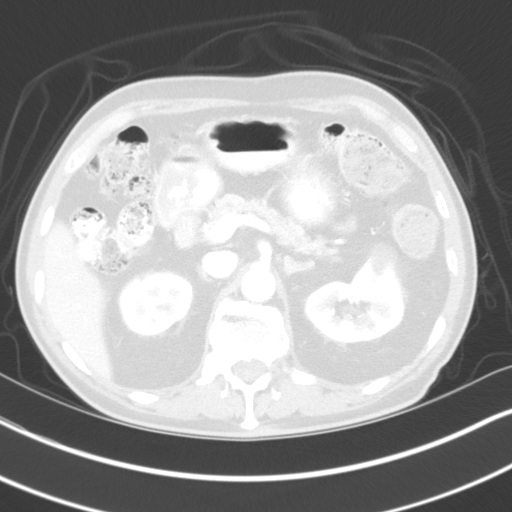
[im 71/93  bone]
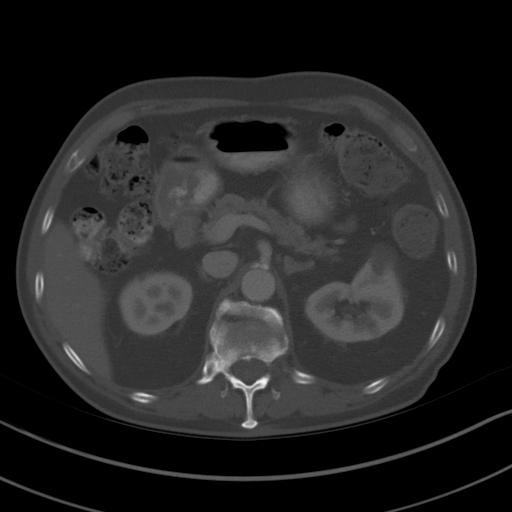
[im 76/93  soft-tissue]
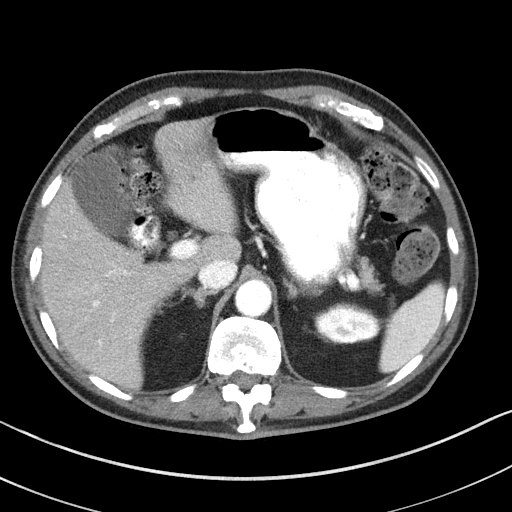
[im 76/93  lung]
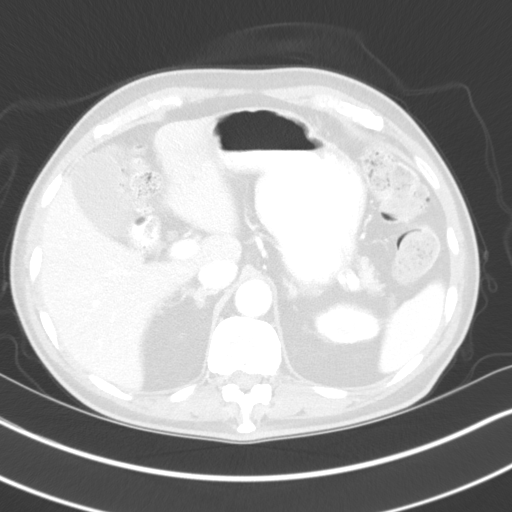
[im 82/93  lung]
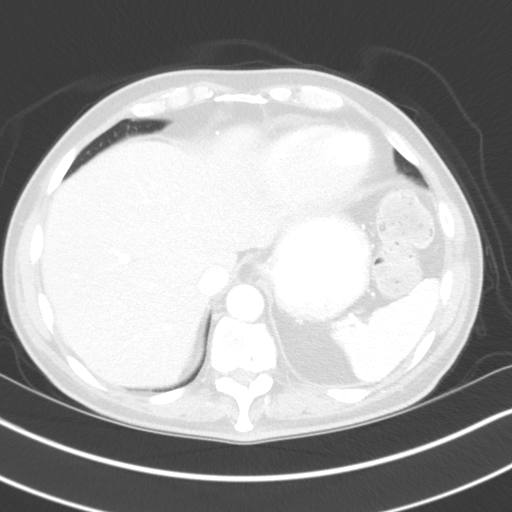
[im 87/93  soft-tissue]
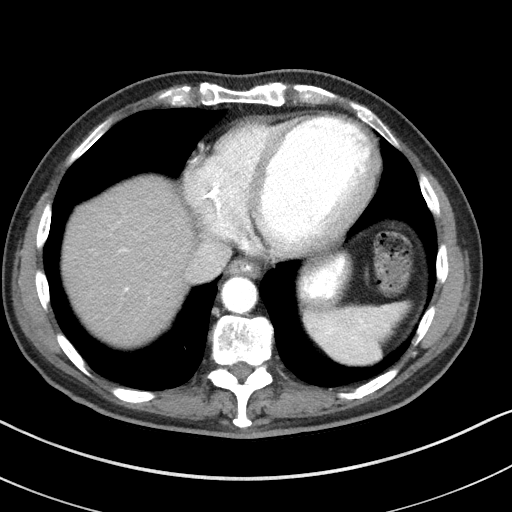
[im 87/93  lung]
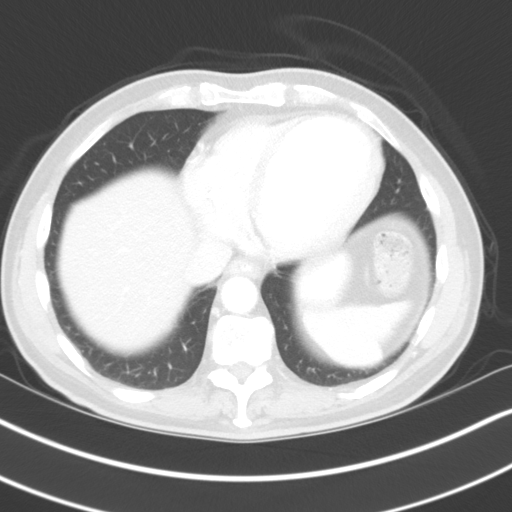

[12 of 32 positions shown; findings below may reference images not displayed]

FINDINGS: Lower chest: Lung bases shows no acute findings. Partially
visualized cardiac pacemaker leads.

Hepatobiliary: Mild fatty infiltration of the liver. No focal
hepatic mass. Stable probable cyst in left hepatic dome measures 8
mm.

Pancreas: Unremarkable. No pancreatic ductal dilatation or
surrounding inflammatory changes.

Spleen: Normal in size without focal abnormality.

Adrenals/Urinary Tract: No adrenal gland mass. Enhanced kidneys are
symmetrical in size. A cyst in lower pole anterior aspect right
kidney Measures 1.3 cm. On the prior exam measures 9 mm. Exophytic
cyst in upper pole anterior aspect of the left kidney measures
cm on the prior exam measures 2 2 cm. A cyst in midpole anterior
aspect of the left kidney measures 2.5 cm on the prior exam measures
1.6 cm. Cyst in midpole posterior aspect of the left kidney measures
1.3 cm on the prior exam measured 1 cm. Delayed renal images shows
bilateral renal symmetrical excretion. Bilateral visualized proximal
ureter is unremarkable.

The urinary bladder is unremarkable. No bladder filling defects are
noted.

Stomach/Bowel: Oral contrast material was given to the patient. No
small bowel obstruction. No pericecal inflammation. Normal appendix
noted in axial image 56. Moderate stool and some contrast material
noted within right colon and cecum. Moderate stool noted within
transverse colon. Some colonic stool noted within descending colon.
Some colonic gas and few diverticula are noted sigmoid colon. No
evidence of acute colitis or diverticulitis. Some colonic gas noted
within rectum. No distal colonic obstruction

Vascular/Lymphatic: Atherosclerotic calcifications of abdominal
aorta and iliac arteries. No aortic aneurysm. No adenopathy.

Reproductive: Again noted multiple radiation seeds in prostate gland
region. No pelvic sidewall adenopathy. No inguinal adenopathy.

Other: Again noted large right hydrocele measures 8.4 by 8 cm. There
is a small left hydrocele measures 3.2 by 3 cm.

No ascites or free abdominal air.

Musculoskeletal: No destructive bony lesions are noted. Sagittal
images of the spine shows degenerative changes thoracolumbar spine.
IMPRESSION: 1. There is no evidence of acute inflammatory process within
abdomen. Mild fatty infiltration of the liver is noted.
2. No hydronephrosis or hydroureter. Mild progression in size
bilateral renal cysts. Bilateral renal symmetrical excretion.
3. No small bowel obstruction. No colonic obstruction. Moderate
stool and some contrast material noted in right colon. Moderate
stool noted in transverse colon. Few diverticula are noted sigmoid
colon. No evidence of acute colitis or diverticulitis. No distal
colonic obstruction.
4. Again noted radiation seeds in prostate gland region.
5. No pericecal inflammation.  Normal appendix.
6. Again noted large right hydrocele measures 8.4 x 8 cm. Small left
hydrocele measures 3.2 x 3 cm.
7. Degenerative changes thoracolumbar spine.

## 2021-02-09 IMAGING — DX DG CHEST 1V PORT
1 series · 1 of 1 positions shown · non-contrast
Comparison: CT, 07/02/2012

CLINICAL DATA: Pt in via EMS from home d/t dizziness AND defib
shock from ICD. First time pt has ever been shocked from it.

EXAM:
PORTABLE CHEST 1 VIEW

[chest ap]
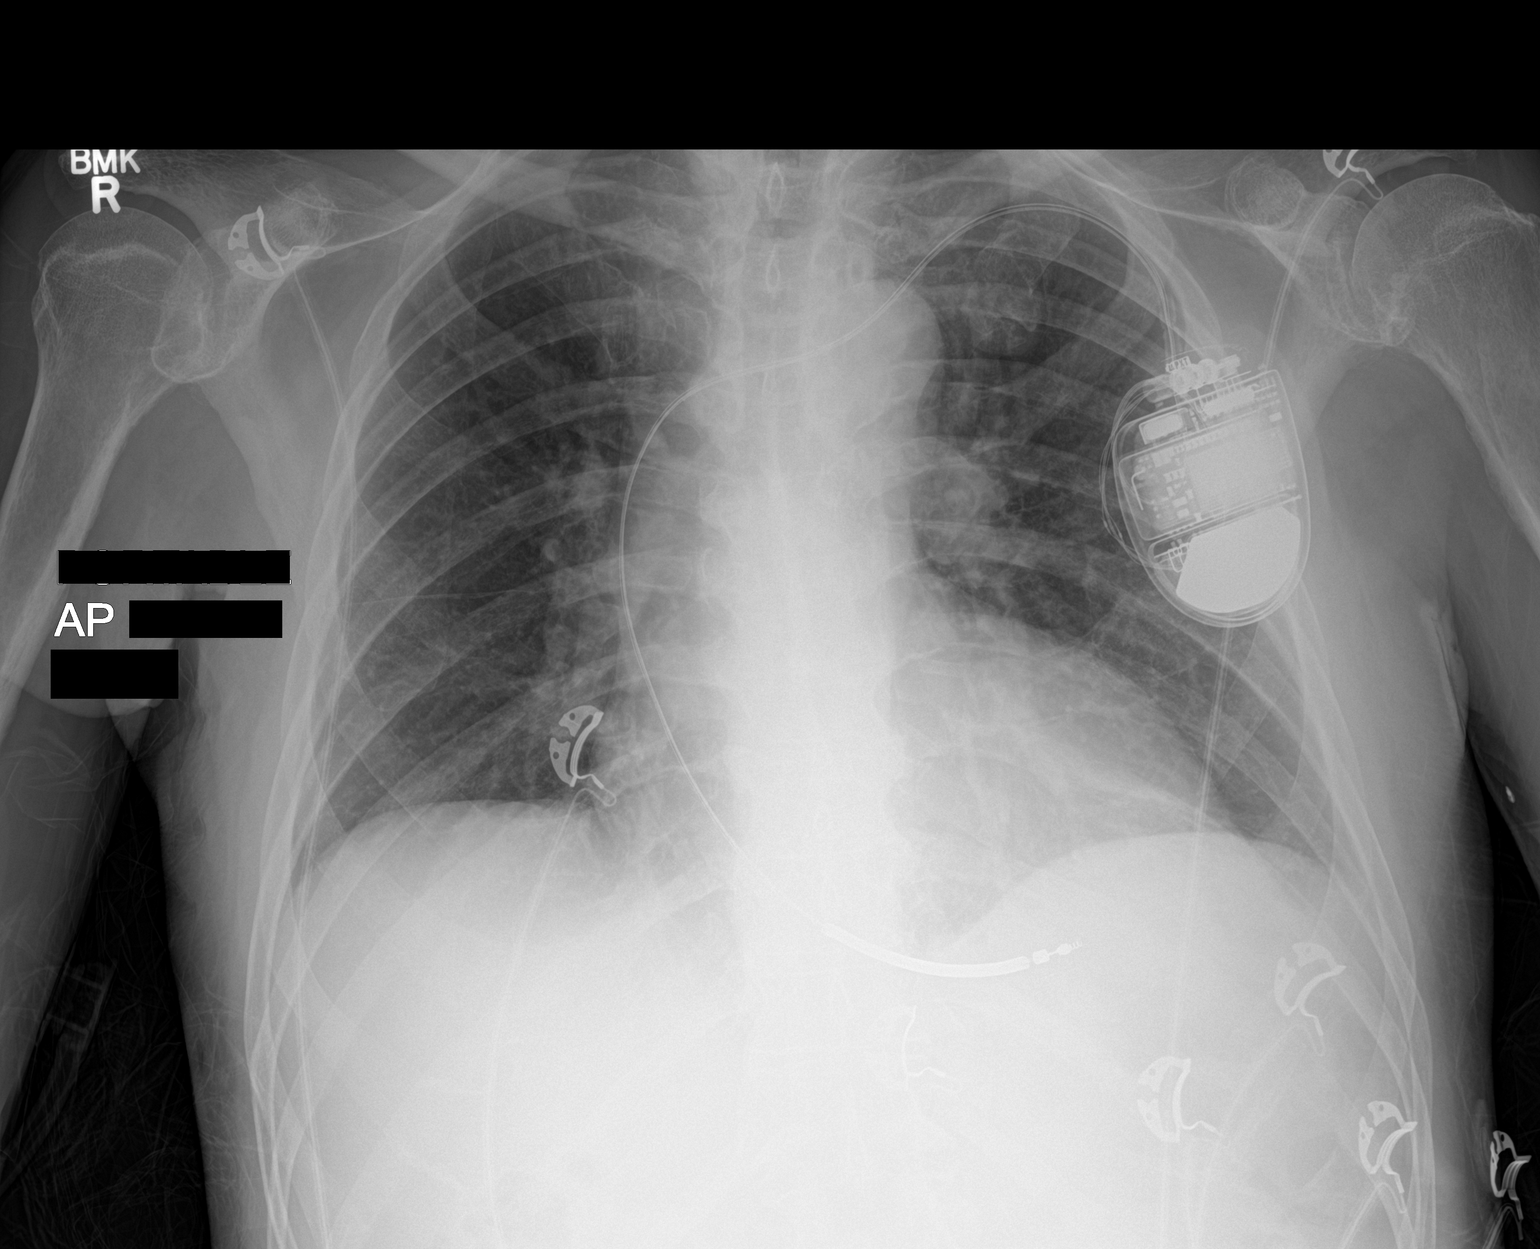

[1 of 1 positions shown; findings below may reference images not displayed]

FINDINGS: Cardiac silhouette is normal in size. No mediastinal or hilar
masses. There is no evidence of adenopathy.

Left anterior chest wall AICD has its lead projecting in the right
ventricle.

Clear lungs.  No pleural effusion or pneumothorax.

Skeletal structures are grossly intact.
IMPRESSION: No active disease.

## 2022-07-10 DEATH — deceased
